# Patient Record
Sex: Male | Born: 1945
Health system: Southern US, Community
[De-identification: ages and names within clinical notes are randomized; demographics above are authoritative.]

## PROBLEM LIST (undated history)

## (undated) DIAGNOSIS — W3400XA Accidental discharge from unspecified firearms or gun, initial encounter: Secondary | ICD-10-CM

## (undated) DIAGNOSIS — K635 Polyp of colon: Secondary | ICD-10-CM

## (undated) DIAGNOSIS — I10 Essential (primary) hypertension: Secondary | ICD-10-CM

## (undated) DIAGNOSIS — G8929 Other chronic pain: Secondary | ICD-10-CM

## (undated) DIAGNOSIS — E7800 Pure hypercholesterolemia, unspecified: Secondary | ICD-10-CM

## (undated) DIAGNOSIS — K648 Other hemorrhoids: Secondary | ICD-10-CM

## (undated) HISTORY — DX: Polyp of colon: K63.5

## (undated) HISTORY — PX: HERNIA REPAIR: SHX51

## (undated) HISTORY — DX: Other hemorrhoids: K64.8

---

## 2011-06-25 ENCOUNTER — Ambulatory Visit: Payer: Self-pay | Admitting: Unknown Physician Specialty

## 2013-12-04 DIAGNOSIS — I8390 Asymptomatic varicose veins of unspecified lower extremity: Secondary | ICD-10-CM | POA: Diagnosis not present

## 2013-12-04 DIAGNOSIS — L821 Other seborrheic keratosis: Secondary | ICD-10-CM | POA: Diagnosis not present

## 2013-12-04 DIAGNOSIS — L819 Disorder of pigmentation, unspecified: Secondary | ICD-10-CM | POA: Diagnosis not present

## 2014-02-06 DIAGNOSIS — M19049 Primary osteoarthritis, unspecified hand: Secondary | ICD-10-CM | POA: Diagnosis not present

## 2014-04-30 DIAGNOSIS — L821 Other seborrheic keratosis: Secondary | ICD-10-CM | POA: Diagnosis not present

## 2014-04-30 DIAGNOSIS — L570 Actinic keratosis: Secondary | ICD-10-CM | POA: Diagnosis not present

## 2014-07-22 DIAGNOSIS — L129 Pemphigoid, unspecified: Secondary | ICD-10-CM | POA: Diagnosis not present

## 2014-07-22 DIAGNOSIS — T148XXA Other injury of unspecified body region, initial encounter: Secondary | ICD-10-CM | POA: Diagnosis not present

## 2014-07-25 DIAGNOSIS — L988 Other specified disorders of the skin and subcutaneous tissue: Secondary | ICD-10-CM | POA: Diagnosis not present

## 2014-07-25 DIAGNOSIS — L253 Unspecified contact dermatitis due to other chemical products: Secondary | ICD-10-CM | POA: Diagnosis not present

## 2014-08-16 DIAGNOSIS — H2510 Age-related nuclear cataract, unspecified eye: Secondary | ICD-10-CM | POA: Diagnosis not present

## 2014-08-20 DIAGNOSIS — L821 Other seborrheic keratosis: Secondary | ICD-10-CM | POA: Diagnosis not present

## 2014-08-20 DIAGNOSIS — L570 Actinic keratosis: Secondary | ICD-10-CM | POA: Diagnosis not present

## 2014-10-15 DIAGNOSIS — G8929 Other chronic pain: Secondary | ICD-10-CM | POA: Insufficient documentation

## 2014-10-15 DIAGNOSIS — I10 Essential (primary) hypertension: Secondary | ICD-10-CM | POA: Insufficient documentation

## 2014-10-15 DIAGNOSIS — E785 Hyperlipidemia, unspecified: Secondary | ICD-10-CM | POA: Insufficient documentation

## 2014-10-15 DIAGNOSIS — M79603 Pain in arm, unspecified: Secondary | ICD-10-CM

## 2015-08-06 ENCOUNTER — Emergency Department: Payer: Medicare Other

## 2015-08-06 ENCOUNTER — Emergency Department
Admission: EM | Admit: 2015-08-06 | Discharge: 2015-08-06 | Disposition: A | Discharge status: Home / Self Care | Payer: Medicare Other | Attending: Emergency Medicine | Admitting: Emergency Medicine

## 2015-08-06 ENCOUNTER — Encounter: Payer: Self-pay | Admitting: Medical Oncology

## 2015-08-06 ENCOUNTER — Other Ambulatory Visit: Payer: Self-pay

## 2015-08-06 DIAGNOSIS — Z87891 Personal history of nicotine dependence: Secondary | ICD-10-CM | POA: Insufficient documentation

## 2015-08-06 DIAGNOSIS — R55 Syncope and collapse: Secondary | ICD-10-CM

## 2015-08-06 DIAGNOSIS — E860 Dehydration: Secondary | ICD-10-CM | POA: Insufficient documentation

## 2015-08-06 DIAGNOSIS — R42 Dizziness and giddiness: Secondary | ICD-10-CM | POA: Diagnosis present

## 2015-08-06 DIAGNOSIS — I951 Orthostatic hypotension: Secondary | ICD-10-CM | POA: Insufficient documentation

## 2015-08-06 DIAGNOSIS — R1084 Generalized abdominal pain: Secondary | ICD-10-CM | POA: Diagnosis not present

## 2015-08-06 DIAGNOSIS — I10 Essential (primary) hypertension: Secondary | ICD-10-CM | POA: Diagnosis not present

## 2015-08-06 DIAGNOSIS — R110 Nausea: Secondary | ICD-10-CM | POA: Diagnosis not present

## 2015-08-06 HISTORY — DX: Accidental discharge from unspecified firearms or gun, initial encounter: W34.00XA

## 2015-08-06 HISTORY — DX: Other chronic pain: G89.29

## 2015-08-06 HISTORY — DX: Pure hypercholesterolemia, unspecified: E78.00

## 2015-08-06 HISTORY — DX: Essential (primary) hypertension: I10

## 2015-08-06 LAB — CBC
HEMATOCRIT: 39.2 % — AB (ref 40.0–52.0)
Hemoglobin: 13.5 g/dL (ref 13.0–18.0)
MCH: 31.6 pg (ref 26.0–34.0)
MCHC: 34.4 g/dL (ref 32.0–36.0)
MCV: 92.1 fL (ref 80.0–100.0)
Platelets: 258 10*3/uL (ref 150–440)
RBC: 4.25 MIL/uL — AB (ref 4.40–5.90)
RDW: 11.9 % (ref 11.5–14.5)
WBC: 14 10*3/uL — AB (ref 3.8–10.6)

## 2015-08-06 LAB — TROPONIN I

## 2015-08-06 LAB — BASIC METABOLIC PANEL
ANION GAP: 12 (ref 5–15)
BUN: 53 mg/dL — ABNORMAL HIGH (ref 6–20)
CHLORIDE: 94 mmol/L — AB (ref 101–111)
CO2: 23 mmol/L (ref 22–32)
Calcium: 9.8 mg/dL (ref 8.9–10.3)
Creatinine, Ser: 1.22 mg/dL (ref 0.61–1.24)
GFR calc non Af Amer: 59 mL/min — ABNORMAL LOW (ref >60)
Glucose, Bld: 158 mg/dL — ABNORMAL HIGH (ref 65–99)
POTASSIUM: 4.1 mmol/L (ref 3.5–5.1)
SODIUM: 129 mmol/L — AB (ref 135–145)

## 2015-08-06 LAB — URINALYSIS COMPLETE WITH MICROSCOPIC (ARMC ONLY)
BACTERIA UA: NONE SEEN
Bilirubin Urine: NEGATIVE
GLUCOSE, UA: NEGATIVE mg/dL
HGB URINE DIPSTICK: NEGATIVE
KETONES UR: NEGATIVE mg/dL
LEUKOCYTES UA: NEGATIVE
NITRITE: NEGATIVE
Protein, ur: NEGATIVE mg/dL
SPECIFIC GRAVITY, URINE: 1.013 (ref 1.005–1.030)
pH: 6 (ref 5.0–8.0)

## 2015-08-06 MED ORDER — SODIUM CHLORIDE 0.9 % IV BOLUS (SEPSIS)
1000.0000 mL | Freq: Once | INTRAVENOUS | Status: AC
  Administered 2015-08-06: 1000 mL via INTRAVENOUS

## 2015-08-06 MED ORDER — ONDANSETRON HCL 4 MG/2ML IJ SOLN
4.0000 mg | Freq: Once | INTRAMUSCULAR | Status: AC
  Administered 2015-08-06: 4 mg via INTRAVENOUS
  Filled 2015-08-06: qty 2

## 2015-08-06 NOTE — ED Notes (Signed)
Pt to triage from [facility] with reports that pt woke up this am with nausea, pt got up to bath room and then felt faint, reports he fell over to wall. Denies hitting head. Since then pt reports he has been light headed and feeling weak.
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
## 2015-08-06 NOTE — Discharge Instructions (Signed)
Your blood tests showed that you have moderate dehydration. Your BUN was 53 with a normal creatinine of 1.22. Your GFR was normal. Your electrolytes are normal. He had a normal red blood cell count but a slight elevation in your White blood cell count to 14,000. Due to this low blood pressure and slight elevation in the white blood cell count we obtained a chest x-ray which was normal. Be sure to drink plenty of fluid. Get a quart bottle and drink 1 quart of water through the morning and one quart of water through the afternoon. This is in addition to any other beverages you may drink at meals.  Follow-up with your regular doctors at the [facility] or with [facility]. Return to the emergency department if you have further weakness, near fainting episodes, or other urgent concerns.  Orthostatic Hypotension Orthostatic hypotension is a sudden drop in blood pressure. It happens when you quickly stand up from a seated or lying position. You may feel dizzy or light-headed. This can last for just a few seconds or for up to a few minutes. It is usually not a serious problem. However, if this happens frequently or gets worse, it can be a sign of something more serious. CAUSES  Different things can cause orthostatic hypotension, including:   Loss of body fluids (dehydration).  Medicines that lower blood pressure.  Sudden changes in posture, such as standing up quickly after you have been sitting or lying down.  Taking too much of your medicine. SIGNS AND SYMPTOMS   Light-headedness or dizziness.   Fainting or near-fainting.   A fast heart rate.   Weakness.   Feeling tired (fatigue).  DIAGNOSIS  Your health care provider may do several things to help diagnose your condition and identify the cause. These may include:   Taking a medical history and doing a physical exam.  Checking your blood pressure. Your health care provider will check your blood pressure when you are:  Lying  down.  Sitting.  Standing.  Using tilt table testing. In this test, you lie down on a table that moves from a lying position to a standing position. You will be strapped onto the table. This test monitors your blood pressure and heart rate when you are in different positions. TREATMENT  Treatment will vary depending on the cause. Possible treatments include:   Changing the dosage of your medicines.  Wearing compression stockings on your lower legs.  Standing up slowly after sitting or lying down.  Eating more salt.  Eating frequent, small meals.  In some cases, getting IV fluids.  Taking medicine to enhance fluid retention. HOME CARE INSTRUCTIONS  Only take over-the-counter or prescription medicines as directed by your health care provider.  Follow your health care provider's instructions for changing the dosage of your current medicines.  Do not stop or adjust your medicine on your own.  Stand up slowly after sitting or lying down. This allows your body to adjust to the different position.  Wear compression stockings as directed.  Eat extra salt as directed.  Do not add extra salt to your diet unless directed to by your health care provider.  Eat frequent, small meals.  Avoid standing suddenly after eating.  Avoid hot showers or excessive heat as directed by your health care provider.  Keep all follow-up appointments. SEEK MEDICAL CARE IF:  You continue to feel dizzy or light-headed after standing.  You feel groggy or confused.  You feel cold, clammy, or sick to your  stomach (nauseous).  You have blurred vision.  You feel short of breath. SEEK IMMEDIATE MEDICAL CARE IF:   You faint after standing.  You have chest pain.  You have difficulty breathing.   You lose feeling or movement in your arms or legs.   You have slurred speech or difficulty talking, or you are unable to talk.  MAKE SURE YOU:   Understand these instructions.  Will watch  your condition.  Will get help right away if you are not doing well or get worse. Document Released: 11/05/2002 Document Revised: 11/20/2013 Document Reviewed: 09/07/2013 [facility] Patient Information 2015 [place], [place]. This information is not intended to replace advice given to you by your health care provider. Make sure you discuss any questions you have with your health care provider.

## 2015-12-05 DIAGNOSIS — H2513 Age-related nuclear cataract, bilateral: Secondary | ICD-10-CM | POA: Diagnosis not present

## 2016-03-02 DIAGNOSIS — J209 Acute bronchitis, unspecified: Secondary | ICD-10-CM | POA: Diagnosis not present

## 2016-04-02 ENCOUNTER — Encounter: Payer: Self-pay | Admitting: Podiatry

## 2016-04-02 ENCOUNTER — Ambulatory Visit (INDEPENDENT_AMBULATORY_CARE_PROVIDER_SITE_OTHER): Payer: Medicare Other | Admitting: Podiatry

## 2016-04-02 VITALS — BP 128/75 | HR 68 | Resp 16 | Ht 73.0 in | Wt 200.0 lb

## 2016-04-02 DIAGNOSIS — L309 Dermatitis, unspecified: Secondary | ICD-10-CM

## 2016-04-02 DIAGNOSIS — B372 Candidiasis of skin and nail: Secondary | ICD-10-CM | POA: Diagnosis not present

## 2016-04-02 MED ORDER — METHYLPREDNISOLONE 4 MG PO TBPK: ORAL_TABLET | ORAL | Status: DC

## 2016-04-02 MED ORDER — TERBINAFINE HCL 250 MG PO TABS: 250.0000 mg | ORAL_TABLET | Freq: Every day | ORAL | Status: DC

## 2016-04-02 NOTE — Progress Notes (Signed)
   Subjective:    Patient ID: Jose Jordan, male    DOB: 06-21-1946, 70 y.o.   MRN: [id number]  HPI Chief Complaint  Patient presents with  . Blisters    Bilateral; Right foot-all toes; Left foot-great toe & 5th toe; pt stated, "Using Dermoplast spray to help with blisters; helping with pain; Dermatologist thought had contact dermatitis"      Review of Systems  HENT: Positive for hearing loss.   Cardiovascular: Positive for leg swelling.  All other systems reviewed and are negative.      Objective:   Physical Exam        Assessment & Plan:

## 2016-04-04 NOTE — Progress Notes (Signed)
Subjective:     Patient ID: Jose Jordan, male   DOB: 02-22-1946, 70 y.o.   MRN: [id number]  HPI patient states that he has developed blisters on all the toes of his right foot and the great toe and fifth toes left. Patient has tried dermoplasty spray and he thought when he went to the dermatologist that he had contact dermatitis   Review of Systems  All other systems reviewed and are negative.      Objective:   Physical Exam  Constitutional: He is oriented to person, place, and time.  Cardiovascular: Intact distal pulses.   Musculoskeletal: Normal range of motion.  Neurological: He is oriented to person, place, and time.  Skin: Skin is warm.  Nursing note and vitals reviewed.  neurovascular status intact with muscle strength adequate range of motion within normal limits. Patient's noted to have the digits 1 through 5 right one and 5 left that's localized in nature and has some redness associated with it but no active drainage or. Once noted. The patient has good digital perfusion and is well oriented 3     Assessment:     Strong possibility for contact dermatitis systemic inflammatory condition even though he's had blood work that so far has ruled that out any kind of inflammatory condition or possibility for fungus    Plan:     H&P and conditions reviewed. At this point I have recommended starting on a six-day steroid pack and when finishing this to 45 days of antifungal. If improvement is not forthcoming I will send him to another dermatologist

## 2016-04-08 DIAGNOSIS — H6982 Other specified disorders of Eustachian tube, left ear: Secondary | ICD-10-CM | POA: Diagnosis not present

## 2016-04-08 DIAGNOSIS — H903 Sensorineural hearing loss, bilateral: Secondary | ICD-10-CM | POA: Diagnosis not present

## 2016-04-13 DIAGNOSIS — L139 Bullous disorder, unspecified: Secondary | ICD-10-CM | POA: Diagnosis not present

## 2016-04-27 DIAGNOSIS — L814 Other melanin hyperpigmentation: Secondary | ICD-10-CM | POA: Diagnosis not present

## 2016-04-27 DIAGNOSIS — D485 Neoplasm of uncertain behavior of skin: Secondary | ICD-10-CM | POA: Diagnosis not present

## 2016-04-27 DIAGNOSIS — D045 Carcinoma in situ of skin of trunk: Secondary | ICD-10-CM | POA: Diagnosis not present

## 2016-04-27 DIAGNOSIS — L259 Unspecified contact dermatitis, unspecified cause: Secondary | ICD-10-CM | POA: Diagnosis not present

## 2016-04-27 DIAGNOSIS — L821 Other seborrheic keratosis: Secondary | ICD-10-CM | POA: Diagnosis not present

## 2016-04-27 DIAGNOSIS — L309 Dermatitis, unspecified: Secondary | ICD-10-CM | POA: Diagnosis not present

## 2016-04-27 DIAGNOSIS — X32XXXA Exposure to sunlight, initial encounter: Secondary | ICD-10-CM | POA: Diagnosis not present

## 2016-05-04 DIAGNOSIS — D044 Carcinoma in situ of skin of scalp and neck: Secondary | ICD-10-CM | POA: Diagnosis not present

## 2016-05-13 ENCOUNTER — Ambulatory Visit (INDEPENDENT_AMBULATORY_CARE_PROVIDER_SITE_OTHER): Payer: Medicare Other | Admitting: Internal Medicine

## 2016-05-13 ENCOUNTER — Encounter: Payer: Self-pay | Admitting: Internal Medicine

## 2016-05-13 VITALS — BP 118/72 | HR 68 | Wt 212.2 lb

## 2016-05-13 DIAGNOSIS — Z860100 Personal history of colon polyps, unspecified: Secondary | ICD-10-CM

## 2016-05-13 DIAGNOSIS — K648 Other hemorrhoids: Secondary | ICD-10-CM

## 2016-05-13 DIAGNOSIS — K625 Hemorrhage of anus and rectum: Secondary | ICD-10-CM

## 2016-05-13 DIAGNOSIS — R151 Fecal smearing: Secondary | ICD-10-CM | POA: Diagnosis not present

## 2016-05-13 MED ORDER — NA SULFATE-K SULFATE-MG SULF 17.5-3.13-1.6 GM/177ML PO SOLN: ORAL | Status: DC
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
## 2016-05-13 NOTE — Patient Instructions (Addendum)
You have been scheduled for a colonoscopy. Please follow written instructions given to you at your visit today.  Please pick up your prep supplies at the pharmacy within the next 1-3 days. If you use inhalers (even only as needed), please bring them with you on the day of your procedure. Your physician has requested that you go to www.startemmi.com and enter the access code given to you at your visit today. This web site gives a general overview about your procedure. However, you should still follow specific instructions given to you by our office regarding your preparation for the procedure.  You have been scheduled for hemorrhoidal banding on 07/14/16 @ 4 pm.  If you are age 20 or older, your body mass index should be between 23-30. Your Body mass index is 28 kg/(m^2). If this is out of the aforementioned range listed, please consider follow up with your Primary Care Provider.  If you are age 92 or younger, your body mass index should be between 19-25. Your Body mass index is 28 kg/(m^2). If this is out of the aformentioned range listed, please consider follow up with your Primary Care Provider.

## 2016-05-27 ENCOUNTER — Encounter: Payer: Self-pay | Admitting: Internal Medicine

## 2016-05-27 ENCOUNTER — Ambulatory Visit (AMBULATORY_SURGERY_CENTER): Payer: Medicare Other | Admitting: Internal Medicine

## 2016-05-27 VITALS — BP 143/82 | HR 62 | Temp 96.2°F | Resp 10 | Wt 212.0 lb

## 2016-05-27 DIAGNOSIS — K621 Rectal polyp: Secondary | ICD-10-CM | POA: Diagnosis not present

## 2016-05-27 DIAGNOSIS — Z86010 Personal history of colonic polyps: Secondary | ICD-10-CM | POA: Diagnosis not present

## 2016-05-27 DIAGNOSIS — I10 Essential (primary) hypertension: Secondary | ICD-10-CM | POA: Diagnosis not present

## 2016-05-27 DIAGNOSIS — D128 Benign neoplasm of rectum: Secondary | ICD-10-CM

## 2016-05-27 MED ORDER — SODIUM CHLORIDE 0.9 % IV SOLN: 500.0000 mL | INTRAVENOUS | Status: DC

## 2016-05-27 NOTE — Progress Notes (Signed)
Report to PACU, RN, vss, BBS= Clear.  

## 2016-05-27 NOTE — Progress Notes (Signed)
Called to room to assist during endoscopic procedure.  Patient ID and intended procedure confirmed with present staff. Received instructions for my participation in the procedure from the performing physician.  

## 2016-05-27 NOTE — Patient Instructions (Signed)
YOU HAD AN ENDOSCOPIC PROCEDURE TODAY AT THE [facility]:   Refer to the procedure report that was given to you for any specific questions about what was found during the examination.  If the procedure report does not answer your questions, please call your gastroenterologist to clarify.  If you requested that your care partner not be given the details of your procedure findings, then the procedure report has been included in a sealed envelope for you to review at your convenience later.  YOU SHOULD EXPECT: Some feelings of bloating in the abdomen. Passage of more gas than usual.  Walking can help get rid of the air that was put into your GI tract during the procedure and reduce the bloating. If you had a lower endoscopy (such as a colonoscopy or flexible sigmoidoscopy) you may notice spotting of blood in your stool or on the toilet paper. If you underwent a bowel prep for your procedure, you may not have a normal bowel movement for a few days.  Please Note:  You might notice some irritation and congestion in your nose or some drainage.  This is from the oxygen used during your procedure.  There is no need for concern and it should clear up in a day or so.  SYMPTOMS TO REPORT IMMEDIATELY:   Following lower endoscopy (colonoscopy or flexible sigmoidoscopy):  Excessive amounts of blood in the stool  Significant tenderness or worsening of abdominal pains  Swelling of the abdomen that is new, acute  Fever of 100F or higher   For urgent or emergent issues, a gastroenterologist can be reached at any hour by calling [phone].   DIET: Your first meal following the procedure should be a small meal and then it is ok to progress to your normal diet. Heavy or fried foods are harder to digest and may make you feel nauseous or bloated.  Likewise, meals heavy in dairy and vegetables can increase bloating.  Drink plenty of fluids but you should avoid alcoholic beverages for 24  hours.  ACTIVITY:  You should plan to take it easy for the rest of today and you should NOT DRIVE or use heavy machinery until tomorrow (because of the sedation medicines used during the test).    FOLLOW UP: Our staff will call the number listed on your records the next business day following your procedure to check on you and address any questions or concerns that you may have regarding the information given to you following your procedure. If we do not reach you, we will leave a message.  However, if you are feeling well and you are not experiencing any problems, there is no need to return our call.  We will assume that you have returned to your regular daily activities without incident.  If any biopsies were taken you will be contacted by phone or by letter within the next 1-3 weeks.  Please call us at [phone] if you have not heard about the biopsies in 3 weeks.    SIGNATURES/CONFIDENTIALITY: You and/or your care partner have signed paperwork which will be entered into your electronic medical record.  These signatures attest to the fact that that the information above on your After Visit Summary has been reviewed and is understood.  Full responsibility of the confidentiality of this discharge information lies with you and/or your care-partner. 

## 2016-05-27 NOTE — Op Note (Signed)
Page Patient Name: Jose Jordan Procedure Date: 05/27/2016 3:46 PM MRN: [id number] Endoscopist: Jerene Bears , MD Age: 70 Referring MD:  Date of Birth: 09-26-1946 Gender: Male Account #: [id number] Procedure:                Colonoscopy Indications:              High risk colon cancer surveillance: Personal                            history of colonic polyps Medicines:                Monitored Anesthesia Care Procedure:                Pre-Anesthesia Assessment:                           - Prior to the procedure, a History and Physical                            was performed, and patient medications and                            allergies were reviewed. The patient's tolerance of                            previous anesthesia was also reviewed. The risks                            and benefits of the procedure and the sedation                            options and risks were discussed with the patient.                            All questions were answered, and informed consent                            was obtained. Prior Anticoagulants: The patient has                            taken no previous anticoagulant or antiplatelet                            agents. ASA Grade Assessment: II - A patient with                            mild systemic disease. After reviewing the risks                            and benefits, the patient was deemed in                            satisfactory condition to undergo the procedure.  After obtaining informed consent, the colonoscope                            was passed under direct vision. Throughout the                            procedure, the patient's blood pressure, pulse, and                            oxygen saturations were monitored continuously. The                            Model CF-HQ190L [id number]) scope was introduced                            through the anus and advanced to the the  cecum,                            identified by appendiceal orifice and ileocecal                            valve. The colonoscopy was performed without                            difficulty. The patient tolerated the procedure                            well. The quality of the bowel preparation was                            good. The ileocecal valve, appendiceal orifice, and                            rectum were photographed. Scope In: 4:00:32 PM Scope Out: 4:20:11 PM Scope Withdrawal Time: 0 hours 15 minutes 5 seconds  Total Procedure Duration: 0 hours 19 minutes 39 seconds  Findings:                 The digital rectal exam was normal.                           A 4 mm polyp was found in the distal rectum                            (initially seen on retroflexed view). The polyp was                            sessile. The polyp was removed with a cold snare.                            Resection and retrieval were complete.                           A 2 mm polyp was found in the rectum,  very near the                            4 mm polyp. The polyp was sessile. The polyp was                            removed with a cold biopsy forceps. Resection and                            retrieval were complete.                           Internal hemorrhoids were found during                            retroflexion. The hemorrhoids were medium-sized.                           The exam was otherwise without abnormality. Complications:            No immediate complications. Estimated Blood Loss:     Estimated blood loss was minimal. Impression:               - One 4 mm polyp in the rectum, removed with a cold                            snare. Resected and retrieved.                           - One 2 mm polyp in the rectum, removed with a cold                            biopsy forceps. Resected and retrieved.                           - Internal hemorrhoids.                           - The  examination was otherwise normal. Recommendation:           - Patient has a contact number available for                            emergencies. The signs and symptoms of potential                            delayed complications were discussed with the                            patient. Return to normal activities tomorrow.                            Written discharge instructions were provided to the                            patient.                           -  Resume previous diet.                           - Continue present medications.                           - Await pathology results.                           - Repeat colonoscopy is recommended for                            surveillance. The colonoscopy date will be                            determined after pathology results from today's                            exam become available for review.                           - If persistent symptomatic internal hemorrhoids                            (bleeding, prolapse, fecal smearing) then                            hemorrhoidal banding appointments can be scheduled. Jerene Bears, MD 05/27/2016 4:26:03 PM This report has been signed electronically.

## 2016-05-28 ENCOUNTER — Telehealth: Payer: Self-pay

## 2016-05-28 NOTE — Telephone Encounter (Signed)
  Follow up Call-  Call back number 05/27/2016  Post procedure Call Back phone  # [phone]  Permission to leave phone message Yes     Patient questions:  Do you have a fever, pain , or abdominal swelling? No. Pain Score  0 *  Have you tolerated food without any problems? Yes.    Have you been able to return to your normal activities? Yes.    Do you have any questions about your discharge instructions: Diet   No. Medications  No. Follow up visit  No.  Do you have questions or concerns about your Care? No.  Actions: * If pain score is 4 or above: No action needed, pain <4.

## 2016-06-04 ENCOUNTER — Encounter: Payer: Self-pay | Admitting: Internal Medicine

## 2016-06-10 ENCOUNTER — Telehealth: Payer: Self-pay | Admitting: Internal Medicine

## 2016-06-10 NOTE — Telephone Encounter (Signed)
I have moved the appt up to 06/14/16 10:00

## 2016-06-10 NOTE — Telephone Encounter (Signed)
Patient is aware of the new appt date and time

## 2016-06-11 ENCOUNTER — Encounter: Payer: Self-pay | Admitting: *Deleted

## 2016-06-14 ENCOUNTER — Ambulatory Visit (INDEPENDENT_AMBULATORY_CARE_PROVIDER_SITE_OTHER): Payer: Medicare Other | Admitting: Internal Medicine

## 2016-06-14 ENCOUNTER — Encounter: Payer: Self-pay | Admitting: Internal Medicine

## 2016-06-14 VITALS — BP 124/80 | HR 66 | Ht 73.0 in | Wt 209.8 lb

## 2016-06-14 DIAGNOSIS — R151 Fecal smearing: Secondary | ICD-10-CM | POA: Diagnosis not present

## 2016-06-14 DIAGNOSIS — K648 Other hemorrhoids: Secondary | ICD-10-CM

## 2016-06-14 NOTE — Patient Instructions (Signed)

## 2016-06-14 NOTE — Progress Notes (Signed)
Patient ID: Jose Jordan, male   DOB: [birth year], 70 y.o.   MRN: [id number] Jose Jordan is a 70 year old male with a history of colon polyps, symptomatic hemorrhoids and GERD who is here to discuss hemorrhoidal banding. He was seen in the office on 05/13/2016 and then had colonoscopy on 05/27/2016. Colonoscopy revealed small rectal polyps which were removed and moderate internal hemorrhoids. The rectal polyps were hyperplastic. He has continued to have symptomatic internal hemorrhoids which for him include prolapse, intermittent rectal bleeding and fecal smearing. He reports he is.with this for a long time and is very interested in banding. We discussed the risk benefits and alternatives and he wishes to proceed   PROCEDURE NOTE:  The patient presents with symptomatic grade 2 internal hemorrhoids, requesting rubber band ligation of his hemorrhoidal disease.  All risks, benefits and alternative forms of therapy were described and informed consent was obtained.   The anorectum was pre-medicated with 0.125% nitroglycerin ointment The decision was made to band the right anterior (RA) internal hemorrhoid, and the [place] was used to perform band ligation without complication.   Digital anorectal examination was then performed to assure proper positioning of the band, and to adjust the banded tissue as required.  The patient was discharged home without pain or other issues.  Dietary and behavioral recommendations were given and along with follow-up instructions.    The patient will return as scheduled for follow-up and possible additional banding as required. No complications were encountered and the patient tolerated the procedure well.

## 2016-07-14 ENCOUNTER — Encounter: Payer: Medicare Other | Admitting: Internal Medicine

## 2016-07-30 ENCOUNTER — Ambulatory Visit (INDEPENDENT_AMBULATORY_CARE_PROVIDER_SITE_OTHER): Payer: Medicare Other | Admitting: Internal Medicine

## 2016-07-30 ENCOUNTER — Encounter: Payer: Self-pay | Admitting: Internal Medicine

## 2016-07-30 VITALS — BP 138/80 | HR 72 | Ht 73.0 in | Wt 211.0 lb

## 2016-07-30 DIAGNOSIS — K648 Other hemorrhoids: Secondary | ICD-10-CM

## 2016-07-30 DIAGNOSIS — R151 Fecal smearing: Secondary | ICD-10-CM | POA: Diagnosis not present

## 2016-07-30 NOTE — Patient Instructions (Addendum)
HEMORRHOID BANDING PROCEDURE    FOLLOW-UP CARE   1. The procedure you have had should have been relatively painless since the banding of the area involved does not have nerve endings and there is no pain sensation.  The rubber band cuts off the blood supply to the hemorrhoid and the band may fall off as soon as 48 hours after the banding (the band may occasionally be seen in the toilet bowl following a bowel movement). You may notice a temporary feeling of fullness in the rectum which should respond adequately to plain Tylenol or Motrin.  2. Following the banding, avoid strenuous exercise that evening and resume full activity the next day.  A sitz bath (soaking in a warm tub) or bidet is soothing, and can be useful for cleansing the area after bowel movements.     3. To avoid constipation, take two tablespoons of natural wheat bran, natural oat bran, flax, Benefiber or any over the counter fiber supplement and increase your water intake to 7-8 glasses daily.    4. Unless you have been prescribed anorectal medication, do not put anything inside your rectum for two weeks: No suppositories, enemas, fingers, etc.  5. Occasionally, you may have more bleeding than usual after the banding procedure.  This is often from the untreated hemorrhoids rather than the treated one.  Don't be concerned if there is a tablespoon or so of blood.  If there is more blood than this, lie flat with your bottom higher than your head and apply an ice pack to the area. If the bleeding does not stop within a half an hour or if you feel faint, call our office at [phone] or go to the emergency room.  6. Problems are not common; however, if there is a substantial amount of bleeding, severe pain, chills, fever or difficulty passing urine (very rare) or other problems, you should call us at [phone] or report to the nearest emergency room.  7. Do not stay seated continuously for more than 2-3 hours for a day or two  after the procedure.  Tighten your buttock muscles 10-15 times every two hours and take 10-15 deep breaths every 1-2 hours.  Do not spend more than a few minutes on the toilet if you cannot empty your bowel; instead re-visit the toilet at a later time.   Next banding is 08/18/16 at 3:30 pm

## 2016-07-30 NOTE — Progress Notes (Signed)
Mr. Edmon returns today for additional hemorrhoidal banding History of symptomatic hemorrhoids including prolapse, intermittent rectal bleeding and fecal smearing Did well with initial banding performed on 06/14/2016 to the right anterior internal hemorrhoid   PROCEDURE NOTE:  The patient presents with symptomatic grade 2 internal hemorrhoids, requesting rubber band ligation of his hemorrhoidal disease.  All risks, benefits and alternative forms of therapy were described and informed consent was obtained.   The anorectum was pre-medicated with 0.125% nitroglycerin ointment The decision was made to band the LL (RA on band #1) internal hemorrhoid, and the [place] was used to perform band ligation without complication.   Digital anorectal examination was then performed to assure proper positioning of the band, and to adjust the banded tissue as required.  The patient was discharged home without pain or other issues.  Dietary and behavioral recommendations were given and along with follow-up instructions.    The patient will return as scheduled for follow-up and possible additional banding as required. No complications were encountered and the patient tolerated the procedure well.

## 2016-08-18 ENCOUNTER — Encounter: Payer: Self-pay | Admitting: Internal Medicine

## 2016-08-18 ENCOUNTER — Ambulatory Visit (INDEPENDENT_AMBULATORY_CARE_PROVIDER_SITE_OTHER): Payer: Medicare Other | Admitting: Internal Medicine

## 2016-08-18 VITALS — BP 108/70 | HR 72 | Ht 73.0 in | Wt 211.8 lb

## 2016-08-18 DIAGNOSIS — K648 Other hemorrhoids: Secondary | ICD-10-CM | POA: Diagnosis not present

## 2016-08-18 NOTE — Patient Instructions (Addendum)
You have been scheduled for your 4th hemorrhoidal banding on Wednesday, 09/08/16 @ 3:30 pm.  HEMORRHOID BANDING PROCEDURE    FOLLOW-UP CARE   1. The procedure you have had should have been relatively painless since the banding of the area involved does not have nerve endings and there is no pain sensation.  The rubber band cuts off the blood supply to the hemorrhoid and the band may fall off as soon as 48 hours after the banding (the band may occasionally be seen in the toilet bowl following a bowel movement). You may notice a temporary feeling of fullness in the rectum which should respond adequately to plain Tylenol or Motrin.  2. Following the banding, avoid strenuous exercise that evening and resume full activity the next day.  A sitz bath (soaking in a warm tub) or bidet is soothing, and can be useful for cleansing the area after bowel movements.     3. To avoid constipation, take two tablespoons of natural wheat bran, natural oat bran, flax, Benefiber or any over the counter fiber supplement and increase your water intake to 7-8 glasses daily.    4. Unless you have been prescribed anorectal medication, do not put anything inside your rectum for two weeks: No suppositories, enemas, fingers, etc.  5. Occasionally, you may have more bleeding than usual after the banding procedure.  This is often from the untreated hemorrhoids rather than the treated one.  Don't be concerned if there is a tablespoon or so of blood.  If there is more blood than this, lie flat with your bottom higher than your head and apply an ice pack to the area. If the bleeding does not stop within a half an hour or if you feel faint, call our office at [phone] or go to the emergency room.  6. Problems are not common; however, if there is a substantial amount of bleeding, severe pain, chills, fever or difficulty passing urine (very rare) or other problems, you should call us at [phone] or report to the nearest  emergency room.  7. Do not stay seated continuously for more than 2-3 hours for a day or two after the procedure.  Tighten your buttock muscles 10-15 times every two hours and take 10-15 deep breaths every 1-2 hours.  Do not spend more than a few minutes on the toilet if you cannot empty your bowel; instead re-visit the toilet at a later time.    If you are age 45 or older, your body mass index should be between 23-30. Your Body mass index is 27.94 kg/m. If this is out of the aforementioned range listed, please consider follow up with your Primary Care Provider.  If you are age 22 or younger, your body mass index should be between 19-25. Your Body mass index is 27.94 kg/m. If this is out of the aformentioned range listed, please consider follow up with your Primary Care Provider.

## 2016-08-20 NOTE — Progress Notes (Signed)
Patient returns for third hemorrhoidal banding appointment History of symptomatic internal hemorrhoids including prolapse, intermittent bleeding and fecal smearing Yesterday after bowel movement he noticed blood dripping into the toilet and on the floor which was painless. It stopped quickly.  PROCEDURE NOTE: The patient presents with symptomatic grade 2 internal hemorrhoids, requesting rubber band ligation of his/her hemorrhoidal disease.  All risks, benefits and alternative forms of therapy were described and informed consent was obtained.  In the Left Lateral Decubitus position anoscopic examination revealed additional internal hemorrhoids in the RP and LL position.  The anorectum was pre-medicated with 0.125% nitroglycerin ointment The decision was made to band the right posterior internal hemorrhoid, and the [place] was used to perform band ligation without complication.  Digital anorectal examination was then performed to assure proper positioning of the band, and to adjust the banded tissue as required.  The patient was discharged home without pain or other issues.  Dietary and behavioral recommendations were given and along with follow-up instructions.     The patient will return as scheduled for  follow-up and possible additional banding as required. No complications were encountered and the patient tolerated the procedure well.

## 2016-09-08 ENCOUNTER — Encounter: Payer: Medicare Other | Admitting: Internal Medicine

## 2016-09-10 DIAGNOSIS — Z23 Encounter for immunization: Secondary | ICD-10-CM | POA: Diagnosis not present

## 2016-09-10 DIAGNOSIS — S0101XA Laceration without foreign body of scalp, initial encounter: Secondary | ICD-10-CM | POA: Diagnosis not present

## 2016-10-08 DIAGNOSIS — E559 Vitamin D deficiency, unspecified: Secondary | ICD-10-CM | POA: Diagnosis not present

## 2016-10-08 DIAGNOSIS — G4752 REM sleep behavior disorder: Secondary | ICD-10-CM | POA: Diagnosis not present

## 2016-10-08 DIAGNOSIS — M799 Soft tissue disorder, unspecified: Secondary | ICD-10-CM | POA: Diagnosis not present

## 2016-10-08 DIAGNOSIS — R258 Other abnormal involuntary movements: Secondary | ICD-10-CM | POA: Diagnosis not present

## 2016-10-08 DIAGNOSIS — E538 Deficiency of other specified B group vitamins: Secondary | ICD-10-CM | POA: Diagnosis not present

## 2016-10-08 DIAGNOSIS — Z87828 Personal history of other (healed) physical injury and trauma: Secondary | ICD-10-CM | POA: Diagnosis not present

## 2016-10-19 ENCOUNTER — Other Ambulatory Visit: Payer: Self-pay | Admitting: Neurology

## 2016-10-19 DIAGNOSIS — G4752 REM sleep behavior disorder: Secondary | ICD-10-CM

## 2016-11-02 DIAGNOSIS — Z85828 Personal history of other malignant neoplasm of skin: Secondary | ICD-10-CM | POA: Diagnosis not present

## 2016-11-02 DIAGNOSIS — X32XXXA Exposure to sunlight, initial encounter: Secondary | ICD-10-CM | POA: Diagnosis not present

## 2016-11-02 DIAGNOSIS — L570 Actinic keratosis: Secondary | ICD-10-CM | POA: Diagnosis not present

## 2016-11-02 DIAGNOSIS — Z08 Encounter for follow-up examination after completed treatment for malignant neoplasm: Secondary | ICD-10-CM | POA: Diagnosis not present

## 2016-11-02 DIAGNOSIS — L821 Other seborrheic keratosis: Secondary | ICD-10-CM | POA: Diagnosis not present

## 2016-11-05 ENCOUNTER — Ambulatory Visit: Admission: RE | Admit: 2016-11-05 | Payer: Medicare Other | Source: Ambulatory Visit

## 2016-11-30 ENCOUNTER — Ambulatory Visit (INDEPENDENT_AMBULATORY_CARE_PROVIDER_SITE_OTHER): Payer: Medicare Other | Admitting: Internal Medicine

## 2016-11-30 ENCOUNTER — Encounter: Payer: Self-pay | Admitting: Internal Medicine

## 2016-11-30 VITALS — BP 124/80 | HR 80 | Ht 69.75 in | Wt 216.2 lb

## 2016-11-30 DIAGNOSIS — K648 Other hemorrhoids: Secondary | ICD-10-CM
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
## 2016-11-30 NOTE — Patient Instructions (Addendum)
You may use your Preparation H intermittently as your doing.  Follow up with Dr Hilarie Fredrickson as needed.

## 2016-12-01 ENCOUNTER — Ambulatory Visit: Admission: RE | Admit: 2016-12-01 | Payer: Medicare Other | Source: Ambulatory Visit

## 2016-12-02 DIAGNOSIS — H918X2 Other specified hearing loss, left ear: Secondary | ICD-10-CM | POA: Diagnosis not present

## 2016-12-02 DIAGNOSIS — H7292 Unspecified perforation of tympanic membrane, left ear: Secondary | ICD-10-CM | POA: Diagnosis not present

## 2016-12-02 DIAGNOSIS — J0100 Acute maxillary sinusitis, unspecified: Secondary | ICD-10-CM | POA: Diagnosis not present

## 2016-12-06 DIAGNOSIS — H6980 Other specified disorders of Eustachian tube, unspecified ear: Secondary | ICD-10-CM | POA: Diagnosis not present

## 2016-12-06 DIAGNOSIS — H6503 Acute serous otitis media, bilateral: Secondary | ICD-10-CM | POA: Diagnosis not present

## 2016-12-14 ENCOUNTER — Ambulatory Visit: Payer: Medicare Other

## 2016-12-17 ENCOUNTER — Ambulatory Visit
Admission: RE | Admit: 2016-12-17 | Discharge: 2016-12-17 | Disposition: A | Discharge status: Home / Self Care | Payer: Medicare Other | Source: Ambulatory Visit | Attending: Neurology | Admitting: Neurology

## 2016-12-17 DIAGNOSIS — G4752 REM sleep behavior disorder: Secondary | ICD-10-CM | POA: Insufficient documentation

## 2016-12-17 MED ORDER — GADOBENATE DIMEGLUMINE 529 MG/ML IV SOLN
20.0000 mL | Freq: Once | INTRAVENOUS | Status: AC | PRN
  Administered 2016-12-17: 20 mL via INTRAVENOUS

## 2016-12-20 DIAGNOSIS — G4752 REM sleep behavior disorder: Secondary | ICD-10-CM | POA: Diagnosis not present

## 2017-05-11 ENCOUNTER — Ambulatory Visit: Payer: Medicare Other | Admitting: Internal Medicine

## 2017-05-11 ENCOUNTER — Telehealth: Payer: Self-pay | Admitting: Internal Medicine

## 2017-05-11 NOTE — Telephone Encounter (Signed)
No charge. 

## 2017-05-20 DIAGNOSIS — Z85828 Personal history of other malignant neoplasm of skin: Secondary | ICD-10-CM | POA: Diagnosis not present

## 2017-05-20 DIAGNOSIS — L821 Other seborrheic keratosis: Secondary | ICD-10-CM | POA: Diagnosis not present

## 2017-05-20 DIAGNOSIS — D225 Melanocytic nevi of trunk: Secondary | ICD-10-CM | POA: Diagnosis not present

## 2017-05-20 DIAGNOSIS — D2262 Melanocytic nevi of left upper limb, including shoulder: Secondary | ICD-10-CM | POA: Diagnosis not present

## 2017-07-11 ENCOUNTER — Encounter: Payer: Self-pay | Admitting: Internal Medicine

## 2017-07-11 ENCOUNTER — Ambulatory Visit (INDEPENDENT_AMBULATORY_CARE_PROVIDER_SITE_OTHER): Payer: Medicare Other | Admitting: Internal Medicine

## 2017-07-11 VITALS — BP 132/74 | HR 79 | Ht 69.75 in | Wt 219.0 lb

## 2017-07-11 DIAGNOSIS — K5903 Drug induced constipation: Secondary | ICD-10-CM

## 2017-07-11 DIAGNOSIS — K648 Other hemorrhoids: Secondary | ICD-10-CM

## 2017-07-11 DIAGNOSIS — T402X5A Adverse effect of other opioids, initial encounter: Secondary | ICD-10-CM | POA: Diagnosis not present

## 2017-07-11 MED ORDER — POLYETHYLENE GLYCOL 3350 17 GM/SCOOP PO POWD: 1.0000 | Freq: Every day | ORAL | 3 refills | Status: DC

## 2017-07-11 NOTE — Progress Notes (Signed)
   Subjective:    Patient ID: Jose Jordan, male    DOB: Oct 24, 1946, 71 y.o.   MRN: [id number]  HPI Jose Jordan is a 71 year old male with a history of hyperplastic colon polyps, internal hemorrhoids, constipation hypertension, hypercholesterolemia and chronic pain on chronic methadone therapy who is seen for follow-up. He is here alone today. He was last seen in January 2018.  He reports that overall he has been feeling well. On 2 occasions in the last 6 months he has seen bright red blood after having a bowel movement. This was painless in nature and stopped immediately. These episodes were isolated. He reports that his hemorrhoidal symptoms have improved significantly since completing hemorrhoidal banding therapy late last year. He does admit to having some constipation and average is 2-3 bowel movements per week. Stools can be hard but rarely require straining. He denies abdominal pain. Reports good appetite. Denies heartburn, dysphagia and odynophagia.   Review of Systems As per history of present illness, otherwise negative  Current Medications, Allergies, Past Medical History, Past Surgical History, Family History and Social History were reviewed in [employer] record.     Objective:   Physical Exam BP 132/74 (BP Location: Left Arm, Patient Position: Sitting, Cuff Size: Normal)   Pulse 79   Ht 5' 9.75" (1.772 m)   Wt 219 lb (99.3 kg)   BMI 31.65 kg/m  Constitutional: Well-developed and well-nourished. No distress. HEENT: Normocephalic and atraumatic.   Conjunctivae are normal.  No scleral icterus.chi. Abdominal: Soft, nontender, nondistended. Bowel sounds active throughout. There are no masses palpable. No hepatosplenomegaly. Extremities: no clubbing, cyanosis Neurological: Alert and oriented to person place and time. Skin: Skin is warm and dry. Psychiatric: Normal mood and affect. Behavior is normal.     Assessment & Plan:  71 year old male with a  history of hyperplastic colon polyps, internal hemorrhoids, constipation hypertension, hypercholesterolemia and chronic pain on chronic methadone therapy who is seen for follow-up.  1. OIC with hx of Internal hemorrhoids -- he had 2 episodes of isolated bleeding likely related to anorectal outlet bleeding in the setting of his constipation. Internal hemorrhoid symptoms have improved significantly after banding. Reassurance provided today. We discussed treatment options for opioid-induced constipation including Movantik. After our discussion we will try MiraLAX 17 g daily. I encouraged him to use this on a consistent daily basis for 2-3 weeks before determining true efficacy. I asked that he email or call me in 3-4 weeks and if no improvement in constipation symptoms we will try Movantik. He is happy with this plan. If bleeding becomes more frequent or should he develop recurrent prolapse symptoms we could consider repeat banding therapy.  He prefers follow-up in 6 months rather than as needed  15 minutes spent with the patient today. Greater than 50% was spent in counseling and coordination of care with the patient

## 2017-07-11 NOTE — Patient Instructions (Signed)
We have sent the following medications to your pharmacy for you to pick up at your convenience: Miralax (Take 17g daily)  Please email or call us in 3-4 weeks to update [place] on your symptoms.  If they are not improved we may try Movantik.  Please follow up with [place] in 6 months.  If you are age 72 or older, your body mass index should be between 23-30. Your Body mass index is 31.65 kg/m. If this is out of the aforementioned range listed, please consider follow up with your Primary Care Provider.  If you are age 102 or younger, your body mass index should be between 19-25. Your Body mass index is 31.65 kg/m. If this is out of the aformentioned range listed, please consider follow up with your Primary Care Provider.

## 2017-07-14 MED ORDER — POLYETHYLENE GLYCOL 3350 17 GM/SCOOP PO POWD: ORAL | 3 refills | Status: DC

## 2017-07-14 NOTE — Addendum Note (Signed)
Addended by: Larina Bras on: 07/14/2017 10:37 AM   Modules accepted: Orders

## 2017-11-18 DIAGNOSIS — B078 Other viral warts: Secondary | ICD-10-CM | POA: Diagnosis not present

## 2017-11-18 DIAGNOSIS — C44719 Basal cell carcinoma of skin of left lower limb, including hip: Secondary | ICD-10-CM | POA: Diagnosis not present

## 2017-11-18 DIAGNOSIS — X32XXXA Exposure to sunlight, initial encounter: Secondary | ICD-10-CM | POA: Diagnosis not present

## 2017-11-18 DIAGNOSIS — L570 Actinic keratosis: Secondary | ICD-10-CM | POA: Diagnosis not present

## 2017-11-18 DIAGNOSIS — L821 Other seborrheic keratosis: Secondary | ICD-10-CM | POA: Diagnosis not present

## 2017-11-18 DIAGNOSIS — L814 Other melanin hyperpigmentation: Secondary | ICD-10-CM | POA: Diagnosis not present

## 2017-11-18 DIAGNOSIS — D485 Neoplasm of uncertain behavior of skin: Secondary | ICD-10-CM | POA: Diagnosis not present

## 2017-11-18 DIAGNOSIS — L538 Other specified erythematous conditions: Secondary | ICD-10-CM | POA: Diagnosis not present

## 2017-11-18 DIAGNOSIS — Z85828 Personal history of other malignant neoplasm of skin: Secondary | ICD-10-CM | POA: Diagnosis not present

## 2018-02-06 DIAGNOSIS — C44719 Basal cell carcinoma of skin of left lower limb, including hip: Secondary | ICD-10-CM | POA: Diagnosis not present

## 2018-05-04 DIAGNOSIS — L570 Actinic keratosis: Secondary | ICD-10-CM | POA: Diagnosis not present

## 2018-05-04 DIAGNOSIS — L820 Inflamed seborrheic keratosis: Secondary | ICD-10-CM | POA: Diagnosis not present

## 2018-05-04 DIAGNOSIS — L538 Other specified erythematous conditions: Secondary | ICD-10-CM | POA: Diagnosis not present

## 2018-05-04 DIAGNOSIS — L821 Other seborrheic keratosis: Secondary | ICD-10-CM | POA: Diagnosis not present

## 2018-05-04 DIAGNOSIS — X32XXXA Exposure to sunlight, initial encounter: Secondary | ICD-10-CM | POA: Diagnosis not present

## 2018-07-18 DIAGNOSIS — M25571 Pain in right ankle and joints of right foot: Secondary | ICD-10-CM | POA: Diagnosis not present

## 2018-07-18 DIAGNOSIS — L299 Pruritus, unspecified: Secondary | ICD-10-CM | POA: Diagnosis not present

## 2018-07-19 ENCOUNTER — Other Ambulatory Visit: Payer: Self-pay

## 2018-07-19 ENCOUNTER — Encounter: Payer: Self-pay | Admitting: Podiatry

## 2018-07-19 ENCOUNTER — Other Ambulatory Visit: Payer: Self-pay | Admitting: Podiatry

## 2018-07-19 ENCOUNTER — Ambulatory Visit (INDEPENDENT_AMBULATORY_CARE_PROVIDER_SITE_OTHER): Payer: Medicare Other | Admitting: Podiatry

## 2018-07-19 ENCOUNTER — Ambulatory Visit (INDEPENDENT_AMBULATORY_CARE_PROVIDER_SITE_OTHER): Payer: Medicare Other

## 2018-07-19 DIAGNOSIS — M25571 Pain in right ankle and joints of right foot: Secondary | ICD-10-CM

## 2018-07-19 DIAGNOSIS — M779 Enthesopathy, unspecified: Secondary | ICD-10-CM

## 2018-07-19 MED ORDER — TRIAMCINOLONE ACETONIDE 10 MG/ML IJ SUSP
10.0000 mg | Freq: Once | INTRAMUSCULAR | Status: AC
  Administered 2018-07-19: 10 mg

## 2018-07-20 DIAGNOSIS — X32XXXA Exposure to sunlight, initial encounter: Secondary | ICD-10-CM | POA: Diagnosis not present

## 2018-07-20 DIAGNOSIS — L821 Other seborrheic keratosis: Secondary | ICD-10-CM | POA: Diagnosis not present

## 2018-07-20 DIAGNOSIS — L570 Actinic keratosis: Secondary | ICD-10-CM | POA: Diagnosis not present

## 2018-07-20 NOTE — Progress Notes (Signed)
Subjective:   Patient ID: Jose Jordan, male   DOB: 72 y.o.   MRN: [id number]   HPI Patient presents stating he is developed a lot of pain in the inside of the right ankle and it started around a month ago with no history of acute injury.  He has been quite active and has been wearing flip-flops and flat shoes and it seemed to occur after a lot of walking in this type of shoe gear.  Patient does not smoke and likes to be active   Review of Systems  All other systems reviewed and are negative.       Objective:  Physical Exam  Constitutional: He appears well-developed and well-nourished.  Cardiovascular: Intact distal pulses.  Pulmonary/Chest: Effort normal.  Musculoskeletal: Normal range of motion.  Neurological: He is alert.  Skin: Skin is warm.  Nursing note and vitals reviewed.   Neurovascular status found to be intact muscle strength is adequate with patient found to have adequate arch with slight depression of the right side with quite a bit of inflammation and pain in the posterior tib groove as it comes under the medial malleolus with no indication of tendon loss or dysfunction.  Patient does have good digital perfusion and is well oriented x3     Assessment:  Probability for posterior tibial tendinitis right secondary to excessive activity     Plan:  H&P conditions reviewed and careful sheath injection administered 3 mg Kenalog 5 mg Xylocaine and applied fascial brace to lift the arch and instructed on exercises and reduced activity and's supportive shoes.  Reappoint to recheck 3 weeks  X-rays indicate there is mild depression of the arch with no indications of arthritis or stress fracture

## 2018-08-02 ENCOUNTER — Ambulatory Visit: Payer: Medicare Other | Admitting: Podiatry

## 2018-10-05 DIAGNOSIS — L309 Dermatitis, unspecified: Secondary | ICD-10-CM | POA: Diagnosis not present

## 2018-10-05 DIAGNOSIS — L308 Other specified dermatitis: Secondary | ICD-10-CM | POA: Diagnosis not present

## 2018-10-10 ENCOUNTER — Other Ambulatory Visit
Admission: RE | Admit: 2018-10-10 | Discharge: 2018-10-10 | Disposition: A | Discharge status: Home / Self Care | Payer: Medicare Other | Source: Ambulatory Visit | Attending: Dermatology | Admitting: Dermatology

## 2018-10-10 LAB — HEPATIC FUNCTION PANEL
ALBUMIN: 4.2 g/dL (ref 3.5–5.0)
ALK PHOS: 73 U/L (ref 38–126)
ALT: 19 U/L (ref 0–44)
AST: 26 U/L (ref 15–41)
Bilirubin, Direct: 0.1 mg/dL (ref 0.0–0.2)
Indirect Bilirubin: 0.6 mg/dL (ref 0.3–0.9)
TOTAL PROTEIN: 6.9 g/dL (ref 6.5–8.1)
Total Bilirubin: 0.7 mg/dL (ref 0.3–1.2)

## 2018-10-10 LAB — BUN: BUN: 19 mg/dL (ref 8–23)

## 2018-10-10 LAB — FERRITIN: FERRITIN: 7 ng/mL — AB (ref 24–336)

## 2018-10-10 LAB — CREATININE, SERUM
CREATININE: 1.02 mg/dL (ref 0.61–1.24)
GFR calc Af Amer: >60 mL/min (ref >60)
GFR calc non Af Amer: >60 mL/min (ref >60)

## 2018-10-10 LAB — HEMOGLOBIN A1C
Hgb A1c MFr Bld: 5.5 % (ref 4.8–5.6)
Mean Plasma Glucose: 111.15 mg/dL

## 2018-10-11 LAB — HEPATITIS B SURFACE ANTIGEN: Hepatitis B Surface Ag: NEGATIVE

## 2018-10-11 LAB — HEPATITIS A ANTIBODY, IGM: HEP A IGM: NEGATIVE

## 2018-10-11 LAB — HEPATITIS B SURFACE ANTIBODY,QUALITATIVE: HEP B S AB: NONREACTIVE

## 2018-10-11 LAB — MISC LABCORP TEST (SEND OUT): LABCORP TEST CODE: 164855

## 2018-10-11 LAB — HEPATITIS C ANTIBODY

## 2018-10-11 LAB — HEPATITIS B CORE ANTIBODY, TOTAL: Hep B Core Total Ab: NEGATIVE

## 2018-10-30 DIAGNOSIS — L299 Pruritus, unspecified: Secondary | ICD-10-CM | POA: Diagnosis not present

## 2018-10-30 DIAGNOSIS — R21 Rash and other nonspecific skin eruption: Secondary | ICD-10-CM | POA: Diagnosis not present

## 2018-10-30 DIAGNOSIS — R790 Abnormal level of blood mineral: Secondary | ICD-10-CM | POA: Diagnosis not present

## 2018-10-30 DIAGNOSIS — R6889 Other general symptoms and signs: Secondary | ICD-10-CM | POA: Diagnosis not present

## 2018-10-31 DIAGNOSIS — L309 Dermatitis, unspecified: Secondary | ICD-10-CM | POA: Diagnosis not present

## 2018-10-31 DIAGNOSIS — L308 Other specified dermatitis: Secondary | ICD-10-CM | POA: Diagnosis not present

## 2018-11-10 DIAGNOSIS — R790 Abnormal level of blood mineral: Secondary | ICD-10-CM | POA: Diagnosis not present

## 2018-11-12 DIAGNOSIS — R790 Abnormal level of blood mineral: Secondary | ICD-10-CM | POA: Insufficient documentation
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
## 2018-11-13 ENCOUNTER — Inpatient Hospital Stay: Payer: Medicare Other

## 2018-11-13 ENCOUNTER — Encounter (INDEPENDENT_AMBULATORY_CARE_PROVIDER_SITE_OTHER): Payer: Self-pay

## 2018-11-13 ENCOUNTER — Inpatient Hospital Stay: Payer: Medicare Other | Attending: Oncology | Admitting: Oncology

## 2018-11-13 ENCOUNTER — Other Ambulatory Visit: Payer: Self-pay

## 2018-11-13 DIAGNOSIS — Z87891 Personal history of nicotine dependence: Secondary | ICD-10-CM | POA: Insufficient documentation

## 2018-11-13 DIAGNOSIS — Z79899 Other long term (current) drug therapy: Secondary | ICD-10-CM | POA: Diagnosis not present

## 2018-11-13 DIAGNOSIS — Z7982 Long term (current) use of aspirin: Secondary | ICD-10-CM | POA: Diagnosis not present

## 2018-11-13 DIAGNOSIS — I10 Essential (primary) hypertension: Secondary | ICD-10-CM | POA: Diagnosis not present

## 2018-11-13 DIAGNOSIS — R790 Abnormal level of blood mineral: Secondary | ICD-10-CM | POA: Insufficient documentation

## 2018-11-13 NOTE — Progress Notes (Signed)
Patient is here today as a new patient. Patient stated that for the past three years he had been seeing his internal medicine and dermatologist. Patient brought in labs from his dermatologist for review. Patient stated that his Ferritin level had been low. Patient denied fever, chills, nausea, vomiting, constipation, diarrhea, weight loss and gain.

## 2018-11-14 DIAGNOSIS — R768 Other specified abnormal immunological findings in serum: Secondary | ICD-10-CM | POA: Insufficient documentation

## 2018-11-14 DIAGNOSIS — R21 Rash and other nonspecific skin eruption: Secondary | ICD-10-CM | POA: Diagnosis not present

## 2018-11-14 DIAGNOSIS — R790 Abnormal level of blood mineral: Secondary | ICD-10-CM | POA: Diagnosis not present

## 2018-11-16 DIAGNOSIS — L2084 Intrinsic (allergic) eczema: Secondary | ICD-10-CM | POA: Diagnosis not present

## 2018-11-16 DIAGNOSIS — L988 Other specified disorders of the skin and subcutaneous tissue: Secondary | ICD-10-CM | POA: Diagnosis not present

## 2018-12-26 DIAGNOSIS — L299 Pruritus, unspecified: Secondary | ICD-10-CM | POA: Diagnosis not present

## 2018-12-26 DIAGNOSIS — J309 Allergic rhinitis, unspecified: Secondary | ICD-10-CM | POA: Diagnosis not present

## 2018-12-26 DIAGNOSIS — R21 Rash and other nonspecific skin eruption: Secondary | ICD-10-CM | POA: Diagnosis not present

## 2019-01-17 DIAGNOSIS — L988 Other specified disorders of the skin and subcutaneous tissue: Secondary | ICD-10-CM | POA: Diagnosis not present

## 2019-01-17 DIAGNOSIS — L2084 Intrinsic (allergic) eczema: Secondary | ICD-10-CM | POA: Diagnosis not present
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
## 2019-05-14 ENCOUNTER — Telehealth: Payer: Self-pay | Admitting: Oncology

## 2019-05-14 NOTE — Telephone Encounter (Signed)
Called pt for appt pre-screen but got no answer. Left vm msg about screening and new guidelines about mask req, no visitors, screening questions they will be asked, and fever checks °

## 2019-05-15 ENCOUNTER — Inpatient Hospital Stay: Payer: Medicare Other | Attending: Oncology

## 2019-05-15 ENCOUNTER — Other Ambulatory Visit: Payer: Self-pay

## 2019-05-15 ENCOUNTER — Ambulatory Visit: Payer: Medicare Other | Admitting: Oncology

## 2019-05-15 DIAGNOSIS — Z87891 Personal history of nicotine dependence: Secondary | ICD-10-CM | POA: Diagnosis not present

## 2019-05-15 DIAGNOSIS — R790 Abnormal level of blood mineral: Secondary | ICD-10-CM | POA: Diagnosis not present

## 2019-05-15 LAB — CBC WITH DIFFERENTIAL/PLATELET
Abs Immature Granulocytes: 0.02 10*3/uL (ref 0.00–0.07)
Basophils Absolute: 0 10*3/uL (ref 0.0–0.1)
Basophils Relative: 1 %
Eosinophils Absolute: 0.3 10*3/uL (ref 0.0–0.5)
Eosinophils Relative: 5 %
HCT: 39.7 % (ref 39.0–52.0)
Hemoglobin: 13.9 g/dL (ref 13.0–17.0)
Immature Granulocytes: 0 %
Lymphocytes Relative: 28 %
Lymphs Abs: 1.8 10*3/uL (ref 0.7–4.0)
MCH: 32.5 pg (ref 26.0–34.0)
MCHC: 35 g/dL (ref 30.0–36.0)
MCV: 92.8 fL (ref 80.0–100.0)
Monocytes Absolute: 0.7 10*3/uL (ref 0.1–1.0)
Monocytes Relative: 11 %
Neutro Abs: 3.4 10*3/uL (ref 1.7–7.7)
Neutrophils Relative %: 55 %
Platelets: 208 10*3/uL (ref 150–400)
RBC: 4.28 MIL/uL (ref 4.22–5.81)
RDW: 11.8 % (ref 11.5–15.5)
WBC: 6.3 10*3/uL (ref 4.0–10.5)
nRBC: 0 % (ref 0.0–0.2)

## 2019-05-15 LAB — FERRITIN: Ferritin: 195 ng/mL (ref 24–336)

## 2019-05-15 LAB — IRON AND TIBC
Iron: 200 ug/dL — ABNORMAL HIGH (ref 45–182)
Saturation Ratios: 84 % — ABNORMAL HIGH (ref 17.9–39.5)
TIBC: 238 ug/dL — ABNORMAL LOW (ref 250–450)
UIBC: 38 ug/dL

## 2019-05-17 ENCOUNTER — Other Ambulatory Visit: Payer: Self-pay

## 2019-05-17 ENCOUNTER — Inpatient Hospital Stay (HOSPITAL_BASED_OUTPATIENT_CLINIC_OR_DEPARTMENT_OTHER): Payer: Medicare Other | Admitting: Oncology

## 2019-05-17 ENCOUNTER — Encounter: Payer: Self-pay | Admitting: Oncology

## 2019-05-17 DIAGNOSIS — R790 Abnormal level of blood mineral: Secondary | ICD-10-CM

## 2019-05-17 DIAGNOSIS — Z87891 Personal history of nicotine dependence: Secondary | ICD-10-CM | POA: Diagnosis not present

## 2019-05-17 NOTE — Progress Notes (Signed)
Patient stated that he had been doing well with no complaints. 

## 2019-08-08 DIAGNOSIS — I10 Essential (primary) hypertension: Secondary | ICD-10-CM | POA: Diagnosis not present

## 2019-08-08 DIAGNOSIS — G8929 Other chronic pain: Secondary | ICD-10-CM | POA: Diagnosis not present

## 2019-08-08 DIAGNOSIS — I8393 Asymptomatic varicose veins of bilateral lower extremities: Secondary | ICD-10-CM | POA: Diagnosis not present

## 2019-08-08 DIAGNOSIS — Z87891 Personal history of nicotine dependence: Secondary | ICD-10-CM | POA: Diagnosis not present

## 2019-08-08 DIAGNOSIS — Z125 Encounter for screening for malignant neoplasm of prostate: Secondary | ICD-10-CM | POA: Diagnosis not present

## 2019-08-08 DIAGNOSIS — M5442 Lumbago with sciatica, left side: Secondary | ICD-10-CM | POA: Diagnosis not present

## 2019-08-08 DIAGNOSIS — E782 Mixed hyperlipidemia: Secondary | ICD-10-CM | POA: Diagnosis not present

## 2019-08-08 DIAGNOSIS — Z0000 Encounter for general adult medical examination without abnormal findings: Secondary | ICD-10-CM | POA: Diagnosis not present

## 2019-08-08 DIAGNOSIS — M47816 Spondylosis without myelopathy or radiculopathy, lumbar region: Secondary | ICD-10-CM | POA: Diagnosis not present

## 2019-08-17 DIAGNOSIS — E871 Hypo-osmolality and hyponatremia: Secondary | ICD-10-CM | POA: Diagnosis not present

## 2019-08-24 DIAGNOSIS — M5136 Other intervertebral disc degeneration, lumbar region: Secondary | ICD-10-CM | POA: Diagnosis not present

## 2019-08-24 DIAGNOSIS — M5416 Radiculopathy, lumbar region: Secondary | ICD-10-CM | POA: Diagnosis not present

## 2019-08-24 DIAGNOSIS — M419 Scoliosis, unspecified: Secondary | ICD-10-CM | POA: Diagnosis not present

## 2019-08-29 DIAGNOSIS — M5442 Lumbago with sciatica, left side: Secondary | ICD-10-CM | POA: Diagnosis not present

## 2019-08-29 DIAGNOSIS — G8929 Other chronic pain: Secondary | ICD-10-CM | POA: Diagnosis not present

## 2019-08-31 ENCOUNTER — Encounter (INDEPENDENT_AMBULATORY_CARE_PROVIDER_SITE_OTHER): Payer: Medicare Other | Admitting: Nurse Practitioner

## 2019-08-31 ENCOUNTER — Encounter (INDEPENDENT_AMBULATORY_CARE_PROVIDER_SITE_OTHER): Payer: Medicare Other

## 2019-09-06 DIAGNOSIS — G8929 Other chronic pain: Secondary | ICD-10-CM | POA: Diagnosis not present

## 2019-09-06 DIAGNOSIS — M5442 Lumbago with sciatica, left side: Secondary | ICD-10-CM | POA: Diagnosis not present

## 2019-09-07 ENCOUNTER — Other Ambulatory Visit (INDEPENDENT_AMBULATORY_CARE_PROVIDER_SITE_OTHER): Payer: Self-pay | Admitting: Nurse Practitioner

## 2019-09-07 DIAGNOSIS — I8393 Asymptomatic varicose veins of bilateral lower extremities: Secondary | ICD-10-CM

## 2019-09-07 DIAGNOSIS — R1900 Intra-abdominal and pelvic swelling, mass and lump, unspecified site: Secondary | ICD-10-CM | POA: Diagnosis not present

## 2019-09-07 DIAGNOSIS — Z87891 Personal history of nicotine dependence: Secondary | ICD-10-CM | POA: Diagnosis not present

## 2019-09-07 DIAGNOSIS — E871 Hypo-osmolality and hyponatremia: Secondary | ICD-10-CM | POA: Diagnosis not present

## 2019-09-07 DIAGNOSIS — I10 Essential (primary) hypertension: Secondary | ICD-10-CM | POA: Diagnosis not present

## 2019-09-10 ENCOUNTER — Ambulatory Visit (INDEPENDENT_AMBULATORY_CARE_PROVIDER_SITE_OTHER): Payer: Medicare Other

## 2019-09-10 ENCOUNTER — Other Ambulatory Visit: Payer: Self-pay

## 2019-09-10 ENCOUNTER — Ambulatory Visit (INDEPENDENT_AMBULATORY_CARE_PROVIDER_SITE_OTHER): Payer: Medicare Other | Admitting: Nurse Practitioner

## 2019-09-10 ENCOUNTER — Encounter (INDEPENDENT_AMBULATORY_CARE_PROVIDER_SITE_OTHER): Payer: Self-pay | Admitting: Nurse Practitioner

## 2019-09-10 VITALS — BP 144/77 | HR 84 | Resp 16 | Wt 211.8 lb

## 2019-09-10 DIAGNOSIS — I8393 Asymptomatic varicose veins of bilateral lower extremities: Secondary | ICD-10-CM | POA: Diagnosis not present

## 2019-09-10 DIAGNOSIS — E785 Hyperlipidemia, unspecified: Secondary | ICD-10-CM

## 2019-09-10 DIAGNOSIS — I8312 Varicose veins of left lower extremity with inflammation: Secondary | ICD-10-CM | POA: Diagnosis not present

## 2019-09-10 DIAGNOSIS — I10 Essential (primary) hypertension: Secondary | ICD-10-CM | POA: Diagnosis not present

## 2019-09-10 DIAGNOSIS — I8311 Varicose veins of right lower extremity with inflammation: Secondary | ICD-10-CM

## 2019-09-12 DIAGNOSIS — G8929 Other chronic pain: Secondary | ICD-10-CM | POA: Diagnosis not present

## 2019-09-12 DIAGNOSIS — M5442 Lumbago with sciatica, left side: Secondary | ICD-10-CM | POA: Diagnosis not present

## 2019-09-16 ENCOUNTER — Encounter (INDEPENDENT_AMBULATORY_CARE_PROVIDER_SITE_OTHER): Payer: Self-pay | Admitting: Nurse Practitioner
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
## 2019-09-25 ENCOUNTER — Other Ambulatory Visit: Payer: Self-pay | Admitting: Surgery

## 2019-09-25 DIAGNOSIS — R222 Localized swelling, mass and lump, trunk: Secondary | ICD-10-CM | POA: Diagnosis not present

## 2019-09-25 DIAGNOSIS — M5442 Lumbago with sciatica, left side: Secondary | ICD-10-CM | POA: Diagnosis not present

## 2019-09-25 DIAGNOSIS — K450 Other specified abdominal hernia with obstruction, without gangrene: Secondary | ICD-10-CM

## 2019-09-25 DIAGNOSIS — G8929 Other chronic pain: Secondary | ICD-10-CM | POA: Diagnosis not present

## 2019-10-02 ENCOUNTER — Ambulatory Visit
Admission: RE | Admit: 2019-10-02 | Discharge: 2019-10-02 | Disposition: A | Discharge status: Home / Self Care | Payer: Medicare Other | Source: Ambulatory Visit | Attending: Surgery | Admitting: Surgery

## 2019-10-02 ENCOUNTER — Other Ambulatory Visit: Payer: Self-pay

## 2019-10-02 DIAGNOSIS — K4020 Bilateral inguinal hernia, without obstruction or gangrene, not specified as recurrent: Secondary | ICD-10-CM | POA: Diagnosis not present

## 2019-10-02 DIAGNOSIS — K450 Other specified abdominal hernia with obstruction, without gangrene: Secondary | ICD-10-CM

## 2019-10-02 MED ORDER — IOHEXOL 300 MG/ML  SOLN
100.0000 mL | Freq: Once | INTRAMUSCULAR | Status: AC | PRN
  Administered 2019-10-02: 100 mL via INTRAVENOUS

## 2019-10-05 ENCOUNTER — Other Ambulatory Visit: Payer: Self-pay | Admitting: Physical Medicine and Rehabilitation

## 2019-10-05 ENCOUNTER — Other Ambulatory Visit (HOSPITAL_COMMUNITY): Payer: Self-pay | Admitting: Physical Medicine and Rehabilitation

## 2019-10-05 DIAGNOSIS — G8929 Other chronic pain: Secondary | ICD-10-CM | POA: Diagnosis not present

## 2019-10-05 DIAGNOSIS — M419 Scoliosis, unspecified: Secondary | ICD-10-CM | POA: Diagnosis not present

## 2019-10-05 DIAGNOSIS — M5442 Lumbago with sciatica, left side: Secondary | ICD-10-CM | POA: Diagnosis not present

## 2019-10-05 DIAGNOSIS — M5136 Other intervertebral disc degeneration, lumbar region: Secondary | ICD-10-CM | POA: Diagnosis not present

## 2019-10-05 DIAGNOSIS — M5416 Radiculopathy, lumbar region: Secondary | ICD-10-CM

## 2019-10-15 ENCOUNTER — Ambulatory Visit: Payer: Medicare Other | Attending: Physical Medicine and Rehabilitation

## 2019-10-17 DIAGNOSIS — R1904 Left lower quadrant abdominal swelling, mass and lump: Secondary | ICD-10-CM | POA: Diagnosis not present

## 2019-10-17 DIAGNOSIS — G8929 Other chronic pain: Secondary | ICD-10-CM | POA: Diagnosis not present

## 2019-10-17 DIAGNOSIS — E782 Mixed hyperlipidemia: Secondary | ICD-10-CM | POA: Diagnosis not present

## 2019-10-17 DIAGNOSIS — Z87891 Personal history of nicotine dependence: Secondary | ICD-10-CM | POA: Diagnosis not present

## 2019-10-17 DIAGNOSIS — Z0000 Encounter for general adult medical examination without abnormal findings: Secondary | ICD-10-CM | POA: Diagnosis not present

## 2019-10-17 DIAGNOSIS — E871 Hypo-osmolality and hyponatremia: Secondary | ICD-10-CM | POA: Diagnosis not present

## 2019-10-17 DIAGNOSIS — I10 Essential (primary) hypertension: Secondary | ICD-10-CM | POA: Diagnosis not present

## 2019-10-17 DIAGNOSIS — M5442 Lumbago with sciatica, left side: Secondary | ICD-10-CM | POA: Diagnosis not present

## 2019-10-30 DIAGNOSIS — M5416 Radiculopathy, lumbar region: Secondary | ICD-10-CM | POA: Diagnosis not present

## 2019-10-30 DIAGNOSIS — M5136 Other intervertebral disc degeneration, lumbar region: Secondary | ICD-10-CM | POA: Diagnosis not present

## 2019-10-30 DIAGNOSIS — M48062 Spinal stenosis, lumbar region with neurogenic claudication: Secondary | ICD-10-CM | POA: Diagnosis not present

## 2019-11-19 ENCOUNTER — Ambulatory Visit
Admission: RE | Admit: 2019-11-19 | Discharge: 2019-11-19 | Disposition: A | Discharge status: Home / Self Care | Payer: Medicare Other | Source: Ambulatory Visit | Attending: Physical Medicine and Rehabilitation | Admitting: Physical Medicine and Rehabilitation

## 2019-11-19 ENCOUNTER — Other Ambulatory Visit: Payer: Self-pay

## 2019-11-19 DIAGNOSIS — M5416 Radiculopathy, lumbar region: Secondary | ICD-10-CM | POA: Diagnosis present

## 2019-12-13 ENCOUNTER — Ambulatory Visit (INDEPENDENT_AMBULATORY_CARE_PROVIDER_SITE_OTHER): Payer: Medicare Other | Admitting: Vascular Surgery

## 2019-12-24 DIAGNOSIS — H2513 Age-related nuclear cataract, bilateral: Secondary | ICD-10-CM | POA: Diagnosis not present

## 2019-12-26 DIAGNOSIS — M419 Scoliosis, unspecified: Secondary | ICD-10-CM | POA: Diagnosis not present

## 2019-12-26 DIAGNOSIS — M48062 Spinal stenosis, lumbar region with neurogenic claudication: Secondary | ICD-10-CM | POA: Diagnosis not present

## 2019-12-26 DIAGNOSIS — M5442 Lumbago with sciatica, left side: Secondary | ICD-10-CM | POA: Diagnosis not present

## 2019-12-26 DIAGNOSIS — M25562 Pain in left knee: Secondary | ICD-10-CM | POA: Diagnosis not present

## 2019-12-26 DIAGNOSIS — G8929 Other chronic pain: Secondary | ICD-10-CM | POA: Diagnosis not present

## 2019-12-26 DIAGNOSIS — M5416 Radiculopathy, lumbar region: Secondary | ICD-10-CM | POA: Diagnosis not present

## 2019-12-26 DIAGNOSIS — M5136 Other intervertebral disc degeneration, lumbar region: Secondary | ICD-10-CM | POA: Diagnosis not present

## 2019-12-31 DIAGNOSIS — M1712 Unilateral primary osteoarthritis, left knee: Secondary | ICD-10-CM | POA: Diagnosis not present

## 2019-12-31 DIAGNOSIS — M7122 Synovial cyst of popliteal space [Baker], left knee: Secondary | ICD-10-CM | POA: Diagnosis not present

## 2020-01-03 DIAGNOSIS — D225 Melanocytic nevi of trunk: Secondary | ICD-10-CM | POA: Diagnosis not present

## 2020-01-03 DIAGNOSIS — Z08 Encounter for follow-up examination after completed treatment for malignant neoplasm: Secondary | ICD-10-CM | POA: Diagnosis not present

## 2020-01-03 DIAGNOSIS — D2262 Melanocytic nevi of left upper limb, including shoulder: Secondary | ICD-10-CM | POA: Diagnosis not present

## 2020-01-03 DIAGNOSIS — Z85828 Personal history of other malignant neoplasm of skin: Secondary | ICD-10-CM | POA: Diagnosis not present

## 2020-01-03 DIAGNOSIS — D2261 Melanocytic nevi of right upper limb, including shoulder: Secondary | ICD-10-CM | POA: Diagnosis not present

## 2020-01-06 ENCOUNTER — Ambulatory Visit: Payer: Medicare Other

## 2020-01-26 ENCOUNTER — Ambulatory Visit: Payer: Medicare Other

## 2020-04-07 DIAGNOSIS — I10 Essential (primary) hypertension: Secondary | ICD-10-CM | POA: Diagnosis not present

## 2020-04-07 DIAGNOSIS — E782 Mixed hyperlipidemia: Secondary | ICD-10-CM | POA: Diagnosis not present

## 2020-04-07 DIAGNOSIS — E871 Hypo-osmolality and hyponatremia: Secondary | ICD-10-CM | POA: Diagnosis not present

## 2020-04-14 DIAGNOSIS — R799 Abnormal finding of blood chemistry, unspecified: Secondary | ICD-10-CM | POA: Diagnosis not present

## 2020-04-14 DIAGNOSIS — M79603 Pain in arm, unspecified: Secondary | ICD-10-CM | POA: Diagnosis not present

## 2020-04-14 DIAGNOSIS — G8929 Other chronic pain: Secondary | ICD-10-CM | POA: Diagnosis not present

## 2020-04-14 DIAGNOSIS — E871 Hypo-osmolality and hyponatremia: Secondary | ICD-10-CM | POA: Diagnosis not present

## 2020-04-14 DIAGNOSIS — I10 Essential (primary) hypertension: Secondary | ICD-10-CM | POA: Diagnosis not present

## 2020-04-14 DIAGNOSIS — E782 Mixed hyperlipidemia: Secondary | ICD-10-CM | POA: Diagnosis not present

## 2020-05-08 DIAGNOSIS — D2272 Melanocytic nevi of left lower limb, including hip: Secondary | ICD-10-CM | POA: Diagnosis not present

## 2020-05-08 DIAGNOSIS — D2271 Melanocytic nevi of right lower limb, including hip: Secondary | ICD-10-CM | POA: Diagnosis not present

## 2020-05-08 DIAGNOSIS — D485 Neoplasm of uncertain behavior of skin: Secondary | ICD-10-CM | POA: Diagnosis not present

## 2020-05-08 DIAGNOSIS — B078 Other viral warts: Secondary | ICD-10-CM | POA: Diagnosis not present

## 2020-05-08 DIAGNOSIS — L821 Other seborrheic keratosis: Secondary | ICD-10-CM | POA: Diagnosis not present

## 2020-05-08 DIAGNOSIS — L570 Actinic keratosis: Secondary | ICD-10-CM | POA: Diagnosis not present

## 2020-05-08 DIAGNOSIS — D2262 Melanocytic nevi of left upper limb, including shoulder: Secondary | ICD-10-CM | POA: Diagnosis not present

## 2020-05-08 DIAGNOSIS — D2261 Melanocytic nevi of right upper limb, including shoulder: Secondary | ICD-10-CM | POA: Diagnosis not present

## 2020-05-08 DIAGNOSIS — D225 Melanocytic nevi of trunk: Secondary | ICD-10-CM | POA: Diagnosis not present

## 2020-08-07 DIAGNOSIS — Z85828 Personal history of other malignant neoplasm of skin: Secondary | ICD-10-CM | POA: Diagnosis not present

## 2020-08-07 DIAGNOSIS — D045 Carcinoma in situ of skin of trunk: Secondary | ICD-10-CM | POA: Diagnosis not present

## 2020-08-07 DIAGNOSIS — L309 Dermatitis, unspecified: Secondary | ICD-10-CM | POA: Diagnosis not present

## 2020-08-07 DIAGNOSIS — D485 Neoplasm of uncertain behavior of skin: Secondary | ICD-10-CM | POA: Diagnosis not present

## 2020-08-07 DIAGNOSIS — Z08 Encounter for follow-up examination after completed treatment for malignant neoplasm: Secondary | ICD-10-CM | POA: Diagnosis not present

## 2020-09-17 IMAGING — CT CT ABD-PELV W/ CM
2 of 5 series · 16 of 46 positions shown, 18 images · IV contrast (omnipaque)
Comparison: None.

CLINICAL DATA: Palpable abnormality in left mid back area. Evaluate
for hernia or mass.

EXAM:
CT ABDOMEN AND PELVIS WITH CONTRAST
TECHNIQUE: Multidetector CT imaging of the abdomen and pelvis was performed
using the standard protocol following bolus administration of
intravenous contrast.
CONTRAST:  100mL OMNIPAQUE IOHEXOL 300 MG/ML  SOLN

[Series 2: abd pelvis 5.00 · axial · 0.78mm/px · z∈[-1530,-1110]mm · 13 of 97 slices shown, 15 images]
[im 7/97  soft-tissue]
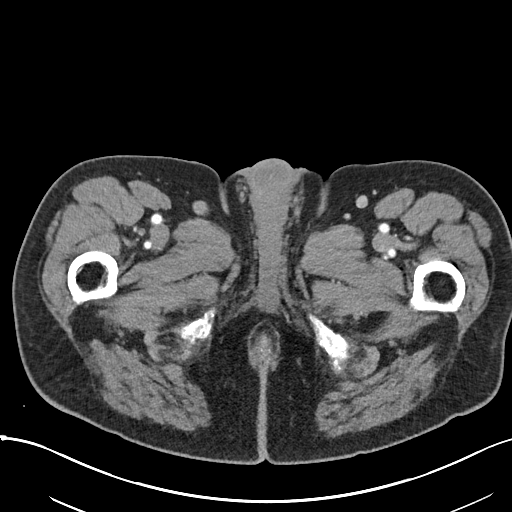
[im 7/97  bone]
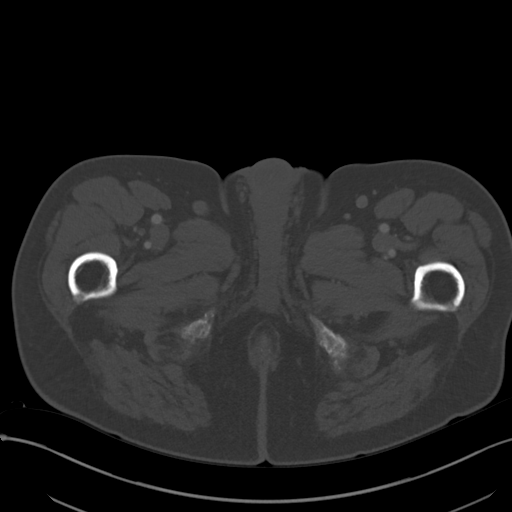
[im 13/97  soft-tissue]
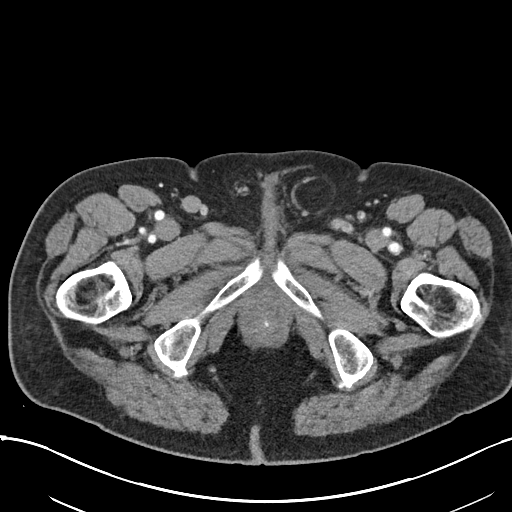
[im 19/97  soft-tissue]
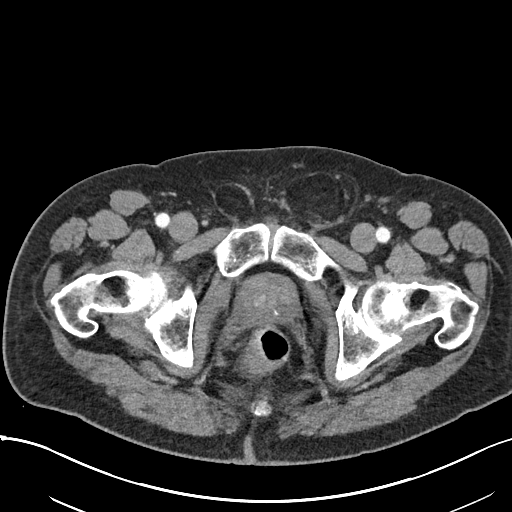
[im 31/97  soft-tissue]
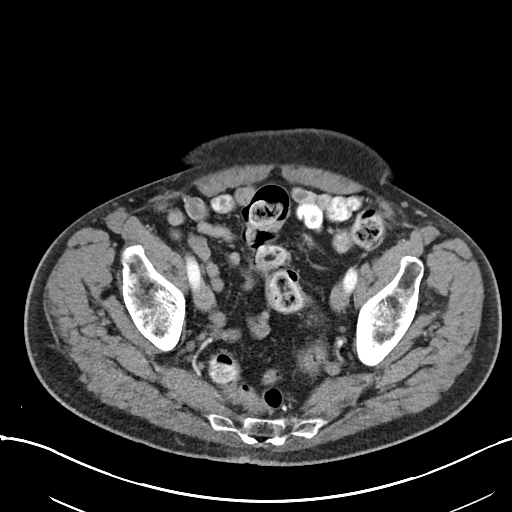
[im 37/97  soft-tissue]
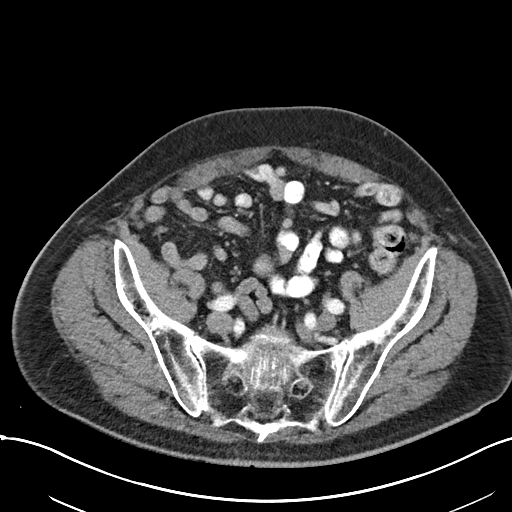
[im 43/97  soft-tissue]
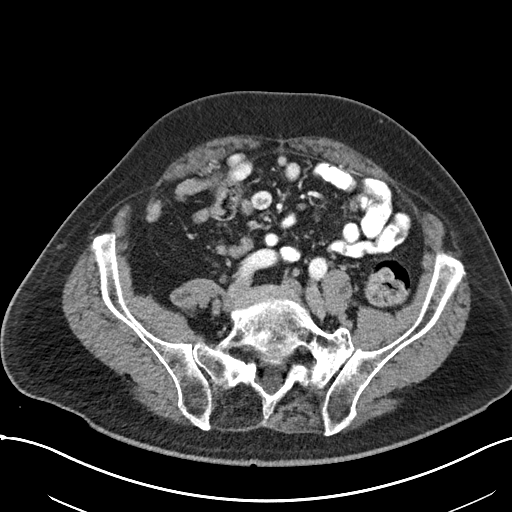
[im 49/97  soft-tissue]
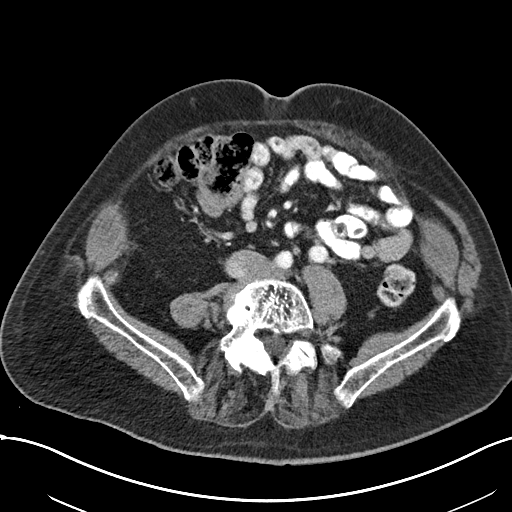
[im 55/97  soft-tissue]
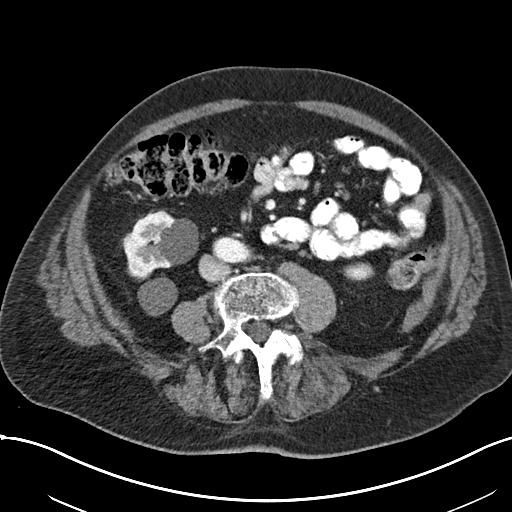
[im 61/97  soft-tissue]
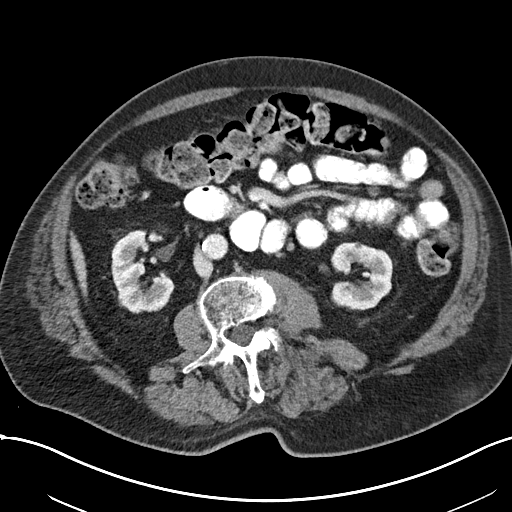
[im 61/97  bone]
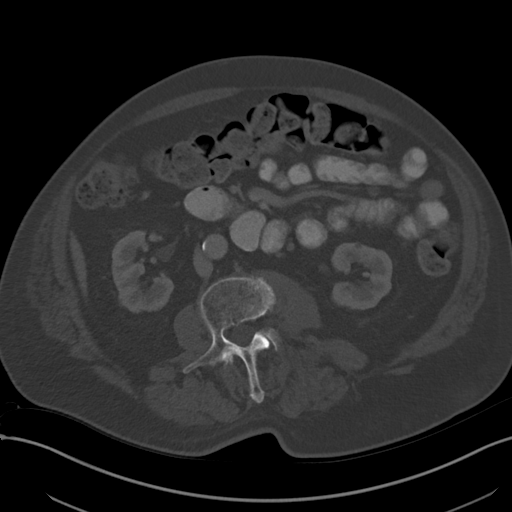
[im 67/97  soft-tissue]
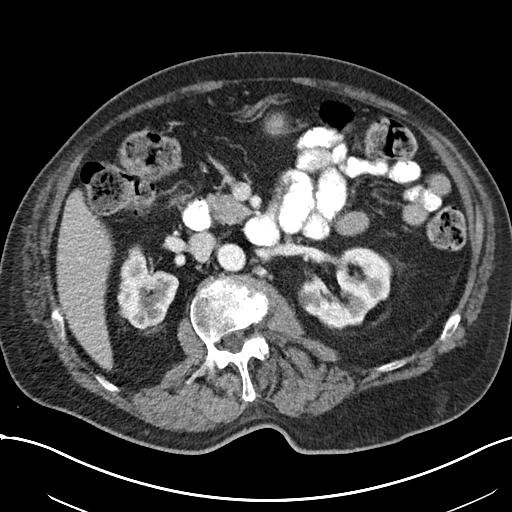
[im 79/97  soft-tissue]
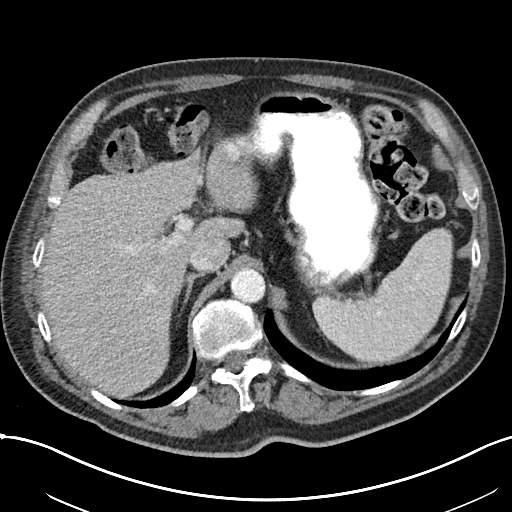
[im 85/97  soft-tissue]
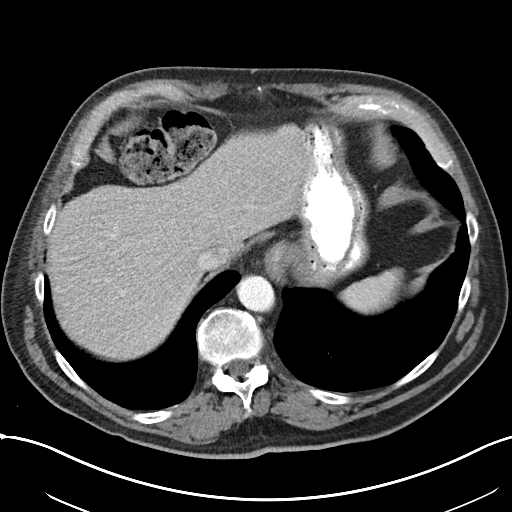
[im 91/97  soft-tissue]
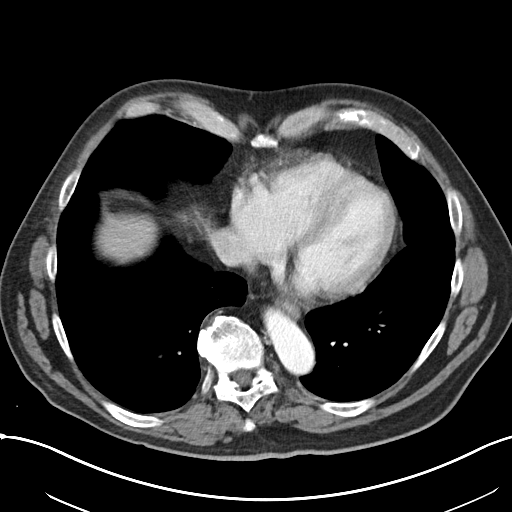

[Series 4: coronals abd pelvis 2.00 cor · coronal · 0.78mm/px · 3 of 163 slices shown]
[im 55/163  soft-tissue]
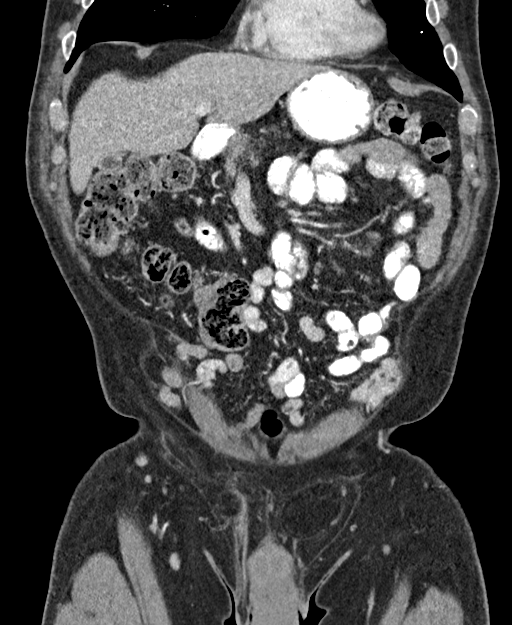
[im 73/163  soft-tissue]
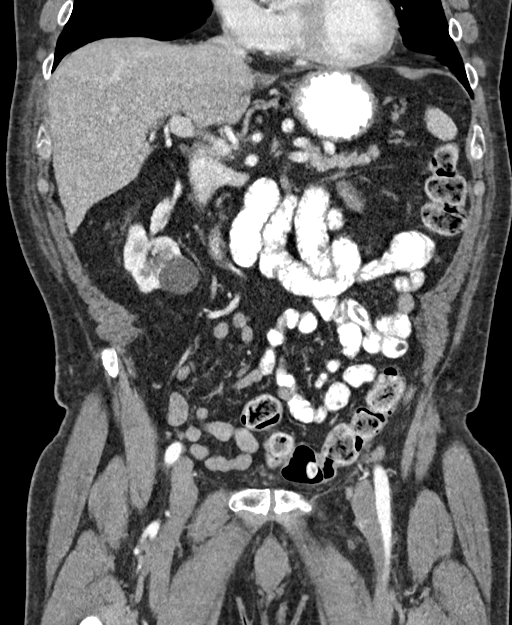
[im 91/163  soft-tissue]
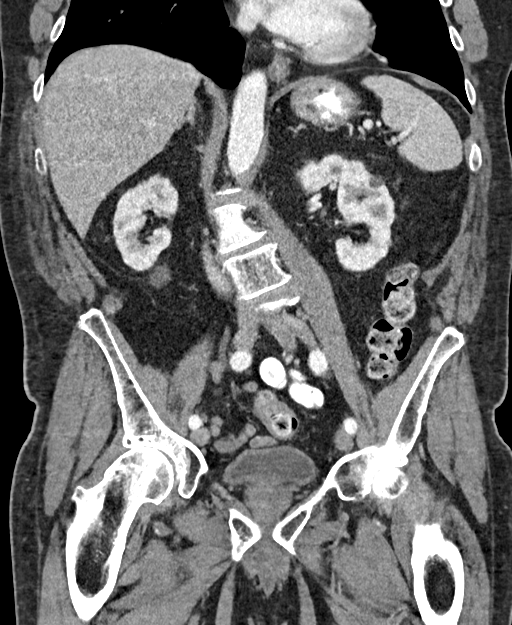

[16 of 46 positions shown; findings below may reference images not displayed]

FINDINGS: Lower Chest: No acute findings.

Hepatobiliary: Tiny sub-cm low-attenuation lesion in left hepatic
lobe is too small to characterize but most likely represents a tiny
cyst. No hepatic masses identified. Gallbladder is unremarkable. No
evidence of biliary ductal dilatation.

Pancreas:  No mass or inflammatory changes.

Spleen: Within normal limits in size and appearance.

Adrenals/Urinary Tract: No masses identified. Several small cysts
are noted in both kidneys. No evidence of hydronephrosis.

Stomach/Bowel: No evidence of obstruction, inflammatory process or
abnormal fluid collections.

Vascular/Lymphatic: No pathologically enlarged lymph nodes. No
abdominal aortic aneurysm.

Reproductive: Moderately enlarged prostate gland with mass effect on
bladder base.

Other: Small bilateral inguinal hernias are seen containing only
fat, left side larger than right. No hernia or mass seen in the left
posterior abdominal wall soft tissues in the area of clinical
concern denoted by skin marker.

Musculoskeletal:  No suspicious bone lesions identified.
IMPRESSION: No evidence of hernia or mass in left posterior abdominal wall soft
tissues, in area of clinical concern denoted by skin marker.

Small bilateral inguinal hernias containing only fat, left side
larger than right.

Moderately enlarged prostate gland.

## 2020-09-25 DIAGNOSIS — D045 Carcinoma in situ of skin of trunk: Secondary | ICD-10-CM | POA: Diagnosis not present

## 2020-10-08 DIAGNOSIS — R799 Abnormal finding of blood chemistry, unspecified: Secondary | ICD-10-CM | POA: Diagnosis not present

## 2020-10-08 DIAGNOSIS — I10 Essential (primary) hypertension: Secondary | ICD-10-CM | POA: Diagnosis not present

## 2020-10-08 DIAGNOSIS — E782 Mixed hyperlipidemia: Secondary | ICD-10-CM | POA: Diagnosis not present

## 2020-10-08 DIAGNOSIS — E871 Hypo-osmolality and hyponatremia: Secondary | ICD-10-CM | POA: Diagnosis not present

## 2020-10-22 DIAGNOSIS — I10 Essential (primary) hypertension: Secondary | ICD-10-CM | POA: Diagnosis not present

## 2020-10-22 DIAGNOSIS — Z0000 Encounter for general adult medical examination without abnormal findings: Secondary | ICD-10-CM | POA: Diagnosis not present

## 2020-10-22 DIAGNOSIS — E782 Mixed hyperlipidemia: Secondary | ICD-10-CM | POA: Diagnosis not present

## 2020-10-22 DIAGNOSIS — E871 Hypo-osmolality and hyponatremia: Secondary | ICD-10-CM | POA: Diagnosis not present

## 2020-10-22 DIAGNOSIS — R799 Abnormal finding of blood chemistry, unspecified: Secondary | ICD-10-CM | POA: Diagnosis not present

## 2020-10-22 DIAGNOSIS — G8929 Other chronic pain: Secondary | ICD-10-CM | POA: Diagnosis not present

## 2022-06-18 ENCOUNTER — Encounter
Admission: RE | Disposition: A | Discharge status: Home / Self Care | Payer: Self-pay | Source: Home / Self Care | Attending: Gastroenterology

## 2022-06-18 ENCOUNTER — Encounter: Payer: Self-pay | Admitting: Gastroenterology

## 2022-06-18 ENCOUNTER — Ambulatory Visit: Payer: Medicare Other | Admitting: Certified Registered Nurse Anesthetist

## 2022-06-18 ENCOUNTER — Ambulatory Visit
Admission: RE | Admit: 2022-06-18 | Discharge: 2022-06-18 | Disposition: A | Discharge status: Home / Self Care | Payer: Medicare Other | Attending: Gastroenterology | Admitting: Gastroenterology

## 2022-06-18 DIAGNOSIS — K635 Polyp of colon: Secondary | ICD-10-CM | POA: Diagnosis not present

## 2022-06-18 DIAGNOSIS — Z87891 Personal history of nicotine dependence: Secondary | ICD-10-CM | POA: Diagnosis not present

## 2022-06-18 DIAGNOSIS — K2980 Duodenitis without bleeding: Secondary | ICD-10-CM | POA: Diagnosis not present

## 2022-06-18 DIAGNOSIS — D123 Benign neoplasm of transverse colon: Secondary | ICD-10-CM | POA: Diagnosis not present

## 2022-06-18 DIAGNOSIS — K2970 Gastritis, unspecified, without bleeding: Secondary | ICD-10-CM | POA: Diagnosis not present

## 2022-06-18 DIAGNOSIS — K219 Gastro-esophageal reflux disease without esophagitis: Secondary | ICD-10-CM | POA: Insufficient documentation

## 2022-06-18 DIAGNOSIS — Z1211 Encounter for screening for malignant neoplasm of colon: Secondary | ICD-10-CM | POA: Diagnosis present

## 2022-06-18 DIAGNOSIS — K641 Second degree hemorrhoids: Secondary | ICD-10-CM | POA: Insufficient documentation

## 2022-06-18 DIAGNOSIS — E7800 Pure hypercholesterolemia, unspecified: Secondary | ICD-10-CM | POA: Diagnosis not present

## 2022-06-18 DIAGNOSIS — Z8711 Personal history of peptic ulcer disease: Secondary | ICD-10-CM | POA: Insufficient documentation

## 2022-06-18 DIAGNOSIS — K6389 Other specified diseases of intestine: Secondary | ICD-10-CM | POA: Insufficient documentation

## 2022-06-18 DIAGNOSIS — K5730 Diverticulosis of large intestine without perforation or abscess without bleeding: Secondary | ICD-10-CM | POA: Insufficient documentation

## 2022-06-18 DIAGNOSIS — G8929 Other chronic pain: Secondary | ICD-10-CM | POA: Diagnosis not present

## 2022-06-18 DIAGNOSIS — Z79899 Other long term (current) drug therapy: Secondary | ICD-10-CM | POA: Diagnosis not present

## 2022-06-18 DIAGNOSIS — I10 Essential (primary) hypertension: Secondary | ICD-10-CM | POA: Diagnosis not present

## 2022-06-18 DIAGNOSIS — Z86010 Personal history of colonic polyps: Secondary | ICD-10-CM | POA: Diagnosis not present

## 2022-06-18 DIAGNOSIS — K2289 Other specified disease of esophagus: Secondary | ICD-10-CM | POA: Insufficient documentation

## 2022-06-18 HISTORY — PX: COLONOSCOPY: SHX5424

## 2022-06-18 HISTORY — PX: ESOPHAGOGASTRODUODENOSCOPY: SHX5428

## 2022-06-18 SURGERY — COLONOSCOPY
Anesthesia: General

## 2022-06-18 MED ORDER — SODIUM CHLORIDE 0.9 % IV SOLN
INTRAVENOUS | Status: DC
  Administered 2022-06-18: 500 mL via INTRAVENOUS

## 2022-06-18 MED ORDER — LIDOCAINE HCL (CARDIAC) PF 100 MG/5ML IV SOSY
PREFILLED_SYRINGE | INTRAVENOUS | Status: DC | PRN
  Administered 2022-06-18: 100 mg via INTRAVENOUS

## 2022-06-18 MED ORDER — PROPOFOL 500 MG/50ML IV EMUL
INTRAVENOUS | Status: DC | PRN
  Administered 2022-06-18: 75 ug/kg/min via INTRAVENOUS

## 2022-06-18 MED ORDER — PHENYLEPHRINE HCL (PRESSORS) 10 MG/ML IV SOLN
INTRAVENOUS | Status: DC | PRN
  Administered 2022-06-18: 120 ug via INTRAVENOUS

## 2022-06-18 MED ORDER — LIDOCAINE HCL (PF) 1 % IJ SOLN
INTRAMUSCULAR | Status: AC
  Filled 2022-06-18: qty 2

## 2022-06-18 MED ORDER — PROPOFOL 10 MG/ML IV BOLUS
INTRAVENOUS | Status: AC
  Filled 2022-06-18: qty 20

## 2022-06-18 MED ORDER — PROPOFOL 10 MG/ML IV BOLUS
INTRAVENOUS | Status: AC
  Filled 2022-06-18: qty 40

## 2022-06-18 MED ORDER — PROPOFOL 10 MG/ML IV BOLUS
INTRAVENOUS | Status: DC | PRN
  Administered 2022-06-18: 40 mg via INTRAVENOUS
  Administered 2022-06-18: 30 mg via INTRAVENOUS
  Administered 2022-06-18: 70 mg via INTRAVENOUS

## 2022-06-18 NOTE — Op Note (Signed)
[facility] [place] [facility] Patient Name: Jose Jordan Procedure Date: 06/18/2022 12:40 PM MRN: [id number] Account #: [id number] Date of Birth: March 13, 1946 Admit Type: Outpatient Age: 76 Room: [facility] ENDO ROOM 1 Gender: Male Note Status: Finalized Instrument Name: Upper Endoscope [id number] Procedure:             Upper GI endoscopy Indications:           Follow-up of acute gastric ulcer Providers:             Annamaria Helling DO, DO Referring MD:          Precious Bard, MD (Referring MD) Medicines:             Monitored Anesthesia Care Complications:         No immediate complications. Estimated blood loss:                         Minimal. Procedure:             Pre-Anesthesia Assessment:                        - Prior to the procedure, a History and Physical was                         performed, and patient medications and allergies were                         reviewed. The patient is competent. The risks and                         benefits of the procedure and the sedation options and                         risks were discussed with the patient. All questions                         were answered and informed consent was obtained.                         Patient identification and proposed procedure were                         verified by the physician, the nurse, the anesthetist                         and the technician in the endoscopy suite. Mental                         Status Examination: alert and oriented. Airway                         Examination: normal oropharyngeal airway and neck                         mobility. Respiratory Examination: clear to                         auscultation. CV Examination: RRR, no murmurs, no S3  or S4. Prophylactic Antibiotics: The patient does not                         require prophylactic antibiotics. Prior                         Anticoagulants: The patient has taken no previous                          anticoagulant or antiplatelet agents. ASA Grade                         Assessment: III - A patient with severe systemic                         disease. After reviewing the risks and benefits, the                         patient was deemed in satisfactory condition to                         undergo the procedure. The anesthesia plan was to use                         monitored anesthesia care (MAC). Immediately prior to                         administration of medications, the patient was                         re-assessed for adequacy to receive sedatives. The                         heart rate, respiratory rate, oxygen saturations,                         blood pressure, adequacy of pulmonary ventilation, and                         response to care were monitored throughout the                         procedure. The physical status of the patient was                         re-assessed after the procedure.                        After obtaining informed consent, the endoscope was                         passed under direct vision. Throughout the procedure,                         the patient's blood pressure, pulse, and oxygen                         saturations were monitored continuously. The  Endosonoscope was introduced through the mouth, and                         advanced to the second part of duodenum. The upper GI                         endoscopy was accomplished without difficulty. The                         patient tolerated the procedure well. Findings:      Localized mild inflammation characterized by erosions was found in the       duodenal bulb and in the first portion of the duodenum. Estimated blood       loss: none.      The exam of the duodenum was otherwise normal.      Diffuse moderate inflammation characterized by erythema was found in the       entire examined stomach. no signs of previous gastric ulcer bx  negative       for h pylori x2 in the past, not repeated. Estimated blood loss: none.      The Z-line was irregular. Mucosa was biopsied with a cold forceps for       histology. A total of 4 specimen bottles were sent to pathology. C2-M5;       GEJ at 42; biopsies taken q2cm in 4 quadrant fashion (44, 42, 40, 38cm       from incisors) Estimated blood loss was minimal.      The exam of the esophagus was otherwise normal. Impression:            - Duodenitis.                        - Gastritis.                        - Z-line irregular. Biopsied. Recommendation:        - Discharge patient to home.                        - Resume previous diet.                        - Continue present medications.                        - No aspirin, ibuprofen, naproxen, or other                         non-steroidal anti-inflammatory drugs for 5 days after                         biopsy.                        - Await pathology results.                        - Repeat upper endoscopy for surveillance based on                         pathology results.                        -  Return to GI office as previously scheduled.                        - proceed with colonoscopy                        - The findings and recommendations were discussed with                         the patient. Procedure Code(s):     --- Professional ---                        [phone], Esophagogastroduodenoscopy, flexible,                         transoral; with biopsy, single or multiple Diagnosis Code(s):     --- Professional ---                        K29.80, Duodenitis without bleeding                        K29.70, Gastritis, unspecified, without bleeding                        K22.8, Other specified diseases of esophagus                        K25.3, Acute gastric ulcer without hemorrhage or                         perforation CPT copyright 2019 [employer] Association. All rights reserved. The codes documented in this  report are preliminary and upon coder review may  be revised to meet current compliance requirements. Attending Participation:      I personally performed the entire procedure. Volney American, DO Annamaria Helling DO, DO 06/18/2022 1:18:46 PM This report has been signed electronically. Number of Addenda: 0 Note Initiated On: 06/18/2022 12:40 PM Estimated Blood Loss:  Estimated blood loss was minimal.      [facility]

## 2022-06-18 NOTE — Interval H&P Note (Signed)
History and Physical Interval Note: Preprocedure H&P from 06/18/22  was reviewed and there was no interval change after seeing and examining the patient.  Written consent was obtained from the patient after discussion of risks, benefits, and alternatives. Patient has consented to proceed with Esophagogastroduodenoscopy and Colonoscopy with possible intervention   06/18/2022 12:47 PM  Jose Jordan  has presented today for surgery, with the diagnosis of Gastrointestinal hemorrhage, unspecified gastrointestinal hemorrhage type (K92.2) History of colon polyps (Z86.010).  The various methods of treatment have been discussed with the patient and family. After consideration of risks, benefits and other options for treatment, the patient has consented to  Procedure(s): COLONOSCOPY (N/A) ESOPHAGOGASTRODUODENOSCOPY (EGD) (N/A) as a surgical intervention.  The patient's history has been reviewed, patient examined, no change in status, stable for surgery.  I have reviewed the patient's chart and labs.  Questions were answered to the patient's satisfaction.     Annamaria Helling

## 2022-06-18 NOTE — Transfer of Care (Signed)
Immediate Anesthesia Transfer of Care Note  Patient: Jose Jordan  Procedure(s) Performed: COLONOSCOPY ESOPHAGOGASTRODUODENOSCOPY (EGD)  Patient Location: PACU and Endoscopy Unit  Anesthesia Type:General  Level of Consciousness: awake, drowsy and patient cooperative  Airway & Oxygen Therapy: Patient Spontanous Breathing  Post-op Assessment: Report given to RN and Post -op Vital signs reviewed and stable  Post vital signs: Reviewed and stable  Last Vitals:  Vitals Value Taken Time  BP 97/68 06/18/22 1352  Temp    Pulse 70 06/18/22 1353  Resp 10 06/18/22 1353  SpO2 98 % 06/18/22 1353  Vitals shown include unvalidated device data.  Last Pain:  Vitals:   06/18/22 1148  TempSrc: Temporal  PainSc: 0-No pain         Complications: No notable events documented.
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
## 2022-06-18 NOTE — Anesthesia Postprocedure Evaluation (Signed)
Anesthesia Post Note  Patient: Jose Jordan  Procedure(s) Performed: COLONOSCOPY ESOPHAGOGASTRODUODENOSCOPY (EGD)  Patient location during evaluation: Endoscopy Anesthesia Type: General Level of consciousness: awake and alert Pain management: pain level controlled Vital Signs Assessment: post-procedure vital signs reviewed and stable Respiratory status: spontaneous breathing, nonlabored ventilation, respiratory function stable and patient connected to nasal cannula oxygen Cardiovascular status: blood pressure returned to baseline and stable Postop Assessment: no apparent nausea or vomiting Anesthetic complications: no   No notable events documented.   Last Vitals:  Vitals:   06/18/22 1402 06/18/22 1412  BP: 110/74 (!) 133/94  Pulse: 64 61  Resp: 11 16  Temp:    SpO2: 98% 100%    Last Pain:  Vitals:   06/18/22 1412  TempSrc:   PainSc: 0-No pain                 Precious Haws [place]

## 2022-06-18 NOTE — Op Note (Signed)
[facility] Gastroenterology Patient Name: Jose Jordan Procedure Date: 06/18/2022 12:39 PM MRN: [id number] Account #: [id number] Date of Birth: Aug 19, 1946 Admit Type: Outpatient Age: 76 Room: [facility] ENDO ROOM 1 Gender: Male Note Status: Finalized Instrument Name: Colonoscope [id number] Procedure:             Colonoscopy Indications:           High risk colon cancer surveillance: Personal history                         of colonic polyps Providers:             Annamaria Helling DO, DO Referring MD:          Precious Bard, MD (Referring MD) Medicines:             Monitored Anesthesia Care Complications:         No immediate complications. Estimated blood loss:                         Minimal. Procedure:             Pre-Anesthesia Assessment:                        - Prior to the procedure, a History and Physical was                         performed, and patient medications and allergies were                         reviewed. The patient is competent. The risks and                         benefits of the procedure and the sedation options and                         risks were discussed with the patient. All questions                         were answered and informed consent was obtained.                         Patient identification and proposed procedure were                         verified by the physician, the nurse, the anesthetist                         and the technician in the endoscopy suite. Mental                         Status Examination: alert and oriented. Airway                         Examination: normal oropharyngeal airway and neck                         mobility. Respiratory Examination: clear to  auscultation. CV Examination: RRR, no murmurs, no S3                         or S4. Prophylactic Antibiotics: The patient does not                         require prophylactic antibiotics. Prior                          Anticoagulants: The patient has taken no previous                         anticoagulant or antiplatelet agents. ASA Grade                         Assessment: III - A patient with severe systemic                         disease. After reviewing the risks and benefits, the                         patient was deemed in satisfactory condition to                         undergo the procedure. The anesthesia plan was to use                         monitored anesthesia care (MAC). Immediately prior to                         administration of medications, the patient was                         re-assessed for adequacy to receive sedatives. The                         heart rate, respiratory rate, oxygen saturations,                         blood pressure, adequacy of pulmonary ventilation, and                         response to care were monitored throughout the                         procedure. The physical status of the patient was                         re-assessed after the procedure.                        After obtaining informed consent, the colonoscope was                         passed under direct vision. Throughout the procedure,                         the patient's blood pressure, pulse, and oxygen  saturations were monitored continuously. The                         Colonoscope was introduced through the anus and                         advanced to the the terminal ileum, with                         identification of the appendiceal orifice and IC                         valve. The colonoscopy was performed without                         difficulty. The patient tolerated the procedure well.                         The quality of the bowel preparation was evaluated                         using the BBPS [facility] Bowel Preparation Scale) with                         scores of: Right Colon = 3, Transverse Colon = 3 and                         Left Colon = 3  (entire mucosa seen well with no                         residual staining, small fragments of stool or opaque                         liquid). The total BBPS score equals 9. The terminal                         ileum, ileocecal valve, appendiceal orifice, and                         rectum were photographed. Findings:      The perianal and digital rectal examinations were normal. Pertinent       negatives include normal sphincter tone.      The terminal ileum appeared normal. Estimated blood loss: none.      Multiple small-mouthed diverticula were found in the left colon.       Estimated blood loss: none.      Non-bleeding internal hemorrhoids were found during retroflexion. The       hemorrhoids were Grade II (internal hemorrhoids that prolapse but reduce       spontaneously). Estimated blood loss: none.      A scattered area of mild melanosis was found in the entire colon.       Estimated blood loss: none.      Two sessile polyps were found in the transverse colon. The polyps were 4       to 5 mm in size. These polyps were removed with a cold snare. Resection       and retrieval were complete. Estimated blood loss was minimal.  Three sessile polyps were found in the sigmoid colon (2) and transverse       colon (1). The polyps were 1 to 2 mm in size. These polyps were removed       with a jumbo cold forceps. Resection and retrieval were complete.       Estimated blood loss was minimal.      The exam was otherwise without abnormality on direct and retroflexion       views. Impression:            - The examined portion of the ileum was normal.                        - Diverticulosis in the left colon.                        - Non-bleeding internal hemorrhoids.                        - Melanosis in the colon.                        - Two 4 to 5 mm polyps in the transverse colon,                         removed with a cold snare. Resected and retrieved.                        - Three  1 to 2 mm polyps in the sigmoid colon and in                         the transverse colon, removed with a jumbo cold                         forceps. Resected and retrieved.                        - The examination was otherwise normal on direct and                         retroflexion views. Recommendation:        - Discharge patient to home.                        - Resume previous diet.                        - Continue present medications.                        - No aspirin, ibuprofen, naproxen, or other                         non-steroidal anti-inflammatory drugs for 5 days after                         polyp removal.                        - Await pathology results.                        -  Repeat colonoscopy for surveillance based on                         pathology results.                        - Return to GI office as previously scheduled.                        - The findings and recommendations were discussed with                         the patient. Procedure Code(s):     --- Professional ---                        [phone], Colonoscopy, flexible; with removal of                         tumor(s), polyp(s), or other lesion(s) by snare                         technique                        45380, 54, Colonoscopy, flexible; with biopsy, single                         or multiple Diagnosis Code(s):     --- Professional ---                        Z86.010, Personal history of colonic polyps                        K64.1, Second degree hemorrhoids                        K63.89, Other specified diseases of intestine                        K63.5, Polyp of colon                        K57.30, Diverticulosis of large intestine without                         perforation or abscess without bleeding CPT copyright 2019 [employer]. All rights reserved. The codes documented in this report are preliminary and upon coder review may  be revised to meet current compliance  requirements. Attending Participation:      I personally performed the entire procedure. Volney American, DO Annamaria Helling DO, DO 06/18/2022 1:51:57 PM This report has been signed electronically. Number of Addenda: 0 Note Initiated On: 06/18/2022 12:39 PM Scope Withdrawal Time: 0 hours 18 minutes 48 seconds  Total Procedure Duration: 0 hours 26 minutes 45 seconds  Estimated Blood Loss:  Estimated blood loss was minimal.      [facility]
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
## 2022-06-21 ENCOUNTER — Encounter: Payer: Self-pay | Admitting: Gastroenterology

## 2022-06-21 LAB — SURGICAL PATHOLOGY

## 2022-09-27 ENCOUNTER — Encounter (INDEPENDENT_AMBULATORY_CARE_PROVIDER_SITE_OTHER): Payer: Self-pay
